# Patient Record
Sex: Male | Born: 1965 | Race: White | Hispanic: No | Marital: Married | Smoking: Never smoker
Health system: Southern US, Community
[De-identification: ages and names within clinical notes are randomized; demographics above are authoritative.]

## PROBLEM LIST (undated history)

## (undated) DIAGNOSIS — E1122 Type 2 diabetes mellitus with diabetic chronic kidney disease: Secondary | ICD-10-CM

## (undated) DIAGNOSIS — N181 Chronic kidney disease, stage 1: Secondary | ICD-10-CM

## (undated) DIAGNOSIS — K760 Fatty (change of) liver, not elsewhere classified: Secondary | ICD-10-CM

## (undated) DIAGNOSIS — K8010 Calculus of gallbladder with chronic cholecystitis without obstruction: Secondary | ICD-10-CM

## (undated) DIAGNOSIS — Z79899 Other long term (current) drug therapy: Secondary | ICD-10-CM

## (undated) DIAGNOSIS — E559 Vitamin D deficiency, unspecified: Secondary | ICD-10-CM

## (undated) DIAGNOSIS — I25119 Atherosclerotic heart disease of native coronary artery with unspecified angina pectoris: Secondary | ICD-10-CM

## (undated) DIAGNOSIS — G43909 Migraine, unspecified, not intractable, without status migrainosus: Secondary | ICD-10-CM

## (undated) DIAGNOSIS — R071 Chest pain on breathing: Secondary | ICD-10-CM

## (undated) DIAGNOSIS — G8929 Other chronic pain: Secondary | ICD-10-CM

## (undated) DIAGNOSIS — I1310 Hypertensive heart and chronic kidney disease without heart failure, with stage 1 through stage 4 chronic kidney disease, or unspecified chronic kidney disease: Secondary | ICD-10-CM

## (undated) DIAGNOSIS — M5136 Other intervertebral disc degeneration, lumbar region: Secondary | ICD-10-CM

## (undated) DIAGNOSIS — F4329 Adjustment disorder with other symptoms: Secondary | ICD-10-CM

## (undated) DIAGNOSIS — E669 Obesity, unspecified: Secondary | ICD-10-CM

## (undated) DIAGNOSIS — I130 Hypertensive heart and chronic kidney disease with heart failure and stage 1 through stage 4 chronic kidney disease, or unspecified chronic kidney disease: Secondary | ICD-10-CM

## (undated) DIAGNOSIS — I2510 Atherosclerotic heart disease of native coronary artery without angina pectoris: Secondary | ICD-10-CM

## (undated) DIAGNOSIS — E785 Hyperlipidemia, unspecified: Secondary | ICD-10-CM

## (undated) DIAGNOSIS — M545 Low back pain: Secondary | ICD-10-CM

## (undated) HISTORY — DX: Chest pain on breathing: R07.1

## (undated) HISTORY — DX: Other intervertebral disc degeneration, lumbar region: M51.36

## (undated) HISTORY — DX: Atherosclerotic heart disease of native coronary artery without angina pectoris: I25.10

## (undated) HISTORY — DX: Adjustment disorder with other symptoms: F43.29

## (undated) HISTORY — DX: Calculus of gallbladder with chronic cholecystitis without obstruction: K80.10

## (undated) HISTORY — DX: Migraine, unspecified, not intractable, without status migrainosus: G43.909

## (undated) HISTORY — DX: Vitamin D deficiency, unspecified: E55.9

## (undated) HISTORY — DX: Atherosclerotic heart disease of native coronary artery with unspecified angina pectoris: I25.119

## (undated) HISTORY — DX: Low back pain: M54.5

## (undated) HISTORY — PX: CORONARY ARTERY BYPASS GRAFT: SHX141

## (undated) HISTORY — DX: Hyperlipidemia, unspecified: E78.5

## (undated) HISTORY — DX: Fatty (change of) liver, not elsewhere classified: K76.0

## (undated) HISTORY — DX: Type 2 diabetes mellitus with diabetic chronic kidney disease: E11.22

## (undated) HISTORY — PX: CORONARY ANGIOPLASTY: SHX604

## (undated) HISTORY — DX: Other long term (current) drug therapy: Z79.899

## (undated) HISTORY — DX: Obesity, unspecified: E66.9

## (undated) HISTORY — DX: Chronic kidney disease, stage 1: N18.1

## (undated) HISTORY — DX: Hypertensive heart and chronic kidney disease with heart failure and stage 1 through stage 4 chronic kidney disease, or unspecified chronic kidney disease: I13.0

## (undated) HISTORY — PX: CARDIAC CATHETERIZATION: SHX172

## (undated) HISTORY — DX: Other chronic pain: G89.29

## (undated) HISTORY — DX: Hypertensive heart and chronic kidney disease without heart failure, with stage 1 through stage 4 chronic kidney disease, or unspecified chronic kidney disease: I13.10

## (undated) HISTORY — PX: SPINE SURGERY: SHX786

---

## 2009-02-02 ENCOUNTER — Ambulatory Visit: Payer: Self-pay | Admitting: Thoracic Surgery (Cardiothoracic Vascular Surgery)

## 2009-03-15 ENCOUNTER — Ambulatory Visit: Payer: Self-pay | Admitting: Thoracic Surgery (Cardiothoracic Vascular Surgery)

## 2011-06-19 ENCOUNTER — Encounter (INDEPENDENT_AMBULATORY_CARE_PROVIDER_SITE_OTHER): Payer: PRIVATE HEALTH INSURANCE

## 2011-06-19 DIAGNOSIS — S2329XA Dislocation of other parts of thorax, initial encounter: Secondary | ICD-10-CM

## 2011-06-20 NOTE — Assessment & Plan Note (Unsigned)
[facility] VISIT  [name], TIEGS DOB:  June 12, 1966                                        June 20, 2011 CHART #:  [id number]  HISTORY OF PRESENT ILLNESS:  The patient had four-vessel coronary bypass grafting on by Dr. Arvilla Market on February 03, 2019, for multivessel coronary artery disease and unstable angina pectoris.  He had an non-ST-elevation myocardial infarction prior to the surgery.  His postoperative course was uneventful and he was discharged home on the fourth postoperative day.  Since his discharge he continues to make progress from cardiac standpoint.  He returned to work which involves some strenuous labor. He has had persistent pain in his left chest while working with his arms and also notices pain when he turns his head to the right.  He also apparently had some numbness in his left arm.  He was seen by Dr. Dulce Sellar, [facility] [place], last month and was doing well from cardiac standpoint.  He was seen by Ms. Prescilla Sours, family nurse practitioner, last week for evaluation of his persistent chest pain.  He apparently was referred for a nerve conduction studies which according to the patient did not show any abnormality.  This study is not available.  On her suggestion, the patient presented to our office for evaluation of the chest discomfort.  The patient describes a sharp pain just the left of his breast bone below the clavicle when he exerts his upper extremities at work.  He also had some numbness down his medial left arm and into his hand but this symptom apparently comes and goes.  On exam, his heart showed regular rate and rhythm.  Breath sounds clear to auscultation. Sternotomy incision is intact and well healed.  Sternum itself appears stable.  He has some tenderness over the costochondral junction involving probably his second and third or third and fourth ribs on the left just below the clavicle.  Palpation in this  region during cough demonstrates some instability as well.  Chest x-ray obtained in the office today shows sternal wires to be in proper alignment.  The bony thorax appears normal except there maybe some increased space at the costochondral function involving the second or third rib on the left.  The patient's symptoms and findings were discussed with Dr. Arvilla Market.  He recommended referring the patient to Dr. Dewayne Shorter at the [facility] for consideration of plating of his ribs in this region to re- stabilize his chest wall and hopefully eliminate his pain.  This was offered to the patient.  Tera Mater. Arvilla Market, MD  MGR/MEDQ  D:  06/20/2011  T:  06/20/2011  Job:  [id number]

## 2011-06-26 ENCOUNTER — Encounter (INDEPENDENT_AMBULATORY_CARE_PROVIDER_SITE_OTHER): Payer: PRIVATE HEALTH INSURANCE | Admitting: Thoracic Surgery

## 2011-06-26 ENCOUNTER — Other Ambulatory Visit: Payer: Self-pay | Admitting: Thoracic Surgery

## 2011-06-26 DIAGNOSIS — R209 Unspecified disturbances of skin sensation: Secondary | ICD-10-CM

## 2011-06-26 DIAGNOSIS — S2329XA Dislocation of other parts of thorax, initial encounter: Secondary | ICD-10-CM

## 2011-06-26 DIAGNOSIS — R200 Anesthesia of skin: Secondary | ICD-10-CM

## 2011-06-26 DIAGNOSIS — G548 Other nerve root and plexus disorders: Secondary | ICD-10-CM

## 2011-06-27 NOTE — Letter (Signed)
June 26, 2011  Jeannett Senior A. Arvilla Market, MD [address] [place], [place]  [address]  Re:  [name], [name]                 DOB:  01-May-1966  Dear Brett Canales,  I saw the patient in the office for left anterior chest wall tightness and pain as well as left arm numbness.  He had coronary artery bypass in February 03, 2009, and his mediastinotomy incision has healed well and he has been worked up in the past for left chest pain that has been negative.  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  When he elevates his left arm, he feels pulling on his left anterior chest and there is some mild tenderness along the costochondral margin.  There was some question of instability.  He is referred here for evaluation.  His past medical history, he has no allergies.  His medications include Crestor, Janumet, aspirin, metoprolol and Cialis.  He has diabetes mellitus type 2, coronary artery disease, hypertension and dyslipidemia.  He has had periodontal abscess and paroxysmal atrial fibrillation.  Previous back surgery in 1991 and 1996 and coronary bypass in February 03, 2009.  FAMILY HISTORY:  Positive for diabetes and coronary artery disease.  SOCIAL HISTORY:  He is married, has 2 children.  Does not smoke. Occasional alcohol intake.  REVIEW OF SYSTEMS:  CARDIAC:  See history of present illness. PULMONARY:  No hemoptysis, fever, chills or excessive sputum.  No asthma or bronchitis. GI:  No GERD, nausea, vomiting and constipation. GU:  No kidney disease, dysuria or frequent urination. VASCULAR:  No claudication, DVT or TIAs. NEUROLOGICAL:  No dizziness, headaches, blackouts or seizures. PSYCHIATRIC:  No depression or nervousness. HEMATOLOGICAL:  No problems with bleeding or clotting disorder.  PHYSICAL EXAMINATION:  General:  He is a well-developed Hispanic male, in no acute distress.  Vital Signs:  His blood pressure is 144/84, pulse 72, respirations 16 and sats were 97%.   Head, Eyes, Ears, Nose And Throat:  Unremarkable.  Neck:  Supple without thyromegaly.  Chest: Clear to auscultation and percussion.  Heart:  Regular sinus rhythm.  No murmurs.  Abdomen:  Soft.  There is no hepatosplenomegaly.  Extremities: Pulses are 2+.  There is no clubbing or edema.  On exam of his left chest and palpation along the third, fourth and fifth costochondral junction, there is some mild tenderness, but I do not see any movement with any change in position of his left arm either with abduction.  He complains of numbness in his left arm, but has good strength and sensory in his left arm both in the ulnar and radial distributions.  He was placed off work until seeing me as I kept him off work for another week.  While I get a CT scan of the chest with contrast of coronary vascular or chest wall abnormalities, I am not sure if we can be of much help to him.  I did start him on Voltaren 75 mg twice a day and see if this helps him.  I will see him back again in the next week.  Ines Bloomer, M.D. Electronically Signed  DPB/MEDQ  D:  06/26/2011  T:  06/27/2011  Job:  [id number]

## 2011-07-03 ENCOUNTER — Ambulatory Visit
Admission: RE | Admit: 2011-07-03 | Discharge: 2011-07-03 | Disposition: A | Discharge status: Home / Self Care | Payer: PRIVATE HEALTH INSURANCE | Source: Ambulatory Visit | Attending: Thoracic Surgery | Admitting: Thoracic Surgery

## 2011-07-03 ENCOUNTER — Ambulatory Visit (INDEPENDENT_AMBULATORY_CARE_PROVIDER_SITE_OTHER): Payer: PRIVATE HEALTH INSURANCE | Admitting: Thoracic Surgery

## 2011-07-03 DIAGNOSIS — R200 Anesthesia of skin: Secondary | ICD-10-CM

## 2011-07-03 DIAGNOSIS — S2329XA Dislocation of other parts of thorax, initial encounter: Secondary | ICD-10-CM

## 2011-07-03 DIAGNOSIS — G548 Other nerve root and plexus disorders: Secondary | ICD-10-CM

## 2011-07-03 MED ORDER — IOHEXOL 300 MG/ML  SOLN
75.0000 mL | Freq: Once | INTRAMUSCULAR | Status: AC | PRN
  Administered 2011-07-03: 75 mL via INTRAVENOUS

## 2011-07-04 NOTE — Letter (Signed)
July 04, 2011  Jeannett Senior A. Arvilla Market, MD [address], [address], [place]  [address]  Re:  [name], [name]                 DOB:  July 04, 1966  Dear Brett Canales,  I appreciate the opportunity of seeing the patient.  As you know, he is complaining of numbness in his left arm and left upper chest pain particularly when he is doing heavy pushing and pulling at work.  He has had previous coronary bypass in March, 2012.  On palpation, there was some mild tenderness over the costochondral in the 2, 3, and 4.  We ordered a CT scan of this area and they did not see any fractures or any healing fractures, and I did not see any real costochondral separation. I think he is just having musculoskeletal pain.  We started him on Voltaren 75 mg twice a day and this helped some, and I wonder if he might benefit from a course of Neurontin or Lyrica.  I gave him a slip to stay out of work for another 2 weeks with no heavy lifting, pushing, or pulling, but I could not be of any further help.  His blood pressure was 126/79, pulse 70, respirations 18, sats were 98%.  I appreciate the opportunity of seeing this patient.  Ines Bloomer, M.D. Electronically Signed  DPB/MEDQ  D:  07/04/2011  T:  07/04/2011  Job:  [id number]

## 2011-08-14 ENCOUNTER — Ambulatory Visit (INDEPENDENT_AMBULATORY_CARE_PROVIDER_SITE_OTHER): Payer: PRIVATE HEALTH INSURANCE | Admitting: Thoracic Surgery (Cardiothoracic Vascular Surgery)

## 2011-08-14 DIAGNOSIS — M25519 Pain in unspecified shoulder: Secondary | ICD-10-CM

## 2011-08-15 NOTE — Progress Notes (Signed)
Mr Bartleson returns for follow up to his visit with Dr Edwyna Shell. His pain has improved since finishing his Voltaren. He does feel some pulling when  he extends his left arm laterally, but no true pain or numbness. He wishes to return to work on Aug 28, 2011. He was provided a note from [place]. He will contact us if he develops any future issues or recurrent pain. We discussed sending him to an orthopedic surgeon if the  pain does recur. RTC PRN.

## 2015-11-10 DIAGNOSIS — R0789 Other chest pain: Secondary | ICD-10-CM

## 2015-11-10 DIAGNOSIS — R071 Chest pain on breathing: Secondary | ICD-10-CM

## 2015-11-10 DIAGNOSIS — I25119 Atherosclerotic heart disease of native coronary artery with unspecified angina pectoris: Secondary | ICD-10-CM

## 2015-11-10 DIAGNOSIS — E785 Hyperlipidemia, unspecified: Secondary | ICD-10-CM

## 2015-11-10 DIAGNOSIS — I130 Hypertensive heart and chronic kidney disease with heart failure and stage 1 through stage 4 chronic kidney disease, or unspecified chronic kidney disease: Secondary | ICD-10-CM

## 2015-11-10 HISTORY — DX: Atherosclerotic heart disease of native coronary artery with unspecified angina pectoris: I25.119

## 2015-11-10 HISTORY — DX: Hyperlipidemia, unspecified: E78.5

## 2015-11-10 HISTORY — DX: Other chest pain: R07.89

## 2015-11-10 HISTORY — DX: Hypertensive heart and chronic kidney disease with heart failure and stage 1 through stage 4 chronic kidney disease, or unspecified chronic kidney disease: I13.0

## 2016-01-15 DIAGNOSIS — I2510 Atherosclerotic heart disease of native coronary artery without angina pectoris: Secondary | ICD-10-CM

## 2016-01-15 DIAGNOSIS — K760 Fatty (change of) liver, not elsewhere classified: Secondary | ICD-10-CM

## 2016-01-15 DIAGNOSIS — M545 Low back pain, unspecified: Secondary | ICD-10-CM

## 2016-01-15 DIAGNOSIS — K8010 Calculus of gallbladder with chronic cholecystitis without obstruction: Secondary | ICD-10-CM

## 2016-01-15 DIAGNOSIS — N181 Chronic kidney disease, stage 1: Secondary | ICD-10-CM | POA: Insufficient documentation

## 2016-01-15 DIAGNOSIS — G43909 Migraine, unspecified, not intractable, without status migrainosus: Secondary | ICD-10-CM

## 2016-01-15 DIAGNOSIS — E559 Vitamin D deficiency, unspecified: Secondary | ICD-10-CM

## 2016-01-15 DIAGNOSIS — Z79899 Other long term (current) drug therapy: Secondary | ICD-10-CM

## 2016-01-15 DIAGNOSIS — E669 Obesity, unspecified: Secondary | ICD-10-CM

## 2016-01-15 DIAGNOSIS — G8929 Other chronic pain: Secondary | ICD-10-CM | POA: Insufficient documentation

## 2016-01-15 DIAGNOSIS — E1122 Type 2 diabetes mellitus with diabetic chronic kidney disease: Secondary | ICD-10-CM

## 2016-01-15 DIAGNOSIS — I1310 Hypertensive heart and chronic kidney disease without heart failure, with stage 1 through stage 4 chronic kidney disease, or unspecified chronic kidney disease: Secondary | ICD-10-CM

## 2016-01-15 DIAGNOSIS — F4329 Adjustment disorder with other symptoms: Secondary | ICD-10-CM

## 2016-01-15 HISTORY — DX: Adjustment disorder with other symptoms: F43.29

## 2016-01-15 HISTORY — DX: Atherosclerotic heart disease of native coronary artery without angina pectoris: I25.10

## 2016-01-15 HISTORY — DX: Calculus of gallbladder with chronic cholecystitis without obstruction: K80.10

## 2016-01-15 HISTORY — DX: Obesity, unspecified: E66.9

## 2016-01-15 HISTORY — DX: Other chronic pain: G89.29

## 2016-01-15 HISTORY — DX: Chronic kidney disease, stage 1: N18.1

## 2016-01-15 HISTORY — DX: Migraine, unspecified, not intractable, without status migrainosus: G43.909

## 2016-01-15 HISTORY — DX: Low back pain, unspecified: M54.50

## 2016-01-15 HISTORY — DX: Vitamin D deficiency, unspecified: E55.9

## 2016-01-15 HISTORY — DX: Type 2 diabetes mellitus with diabetic chronic kidney disease: E11.22

## 2016-01-15 HISTORY — DX: Fatty (change of) liver, not elsewhere classified: K76.0

## 2016-01-15 HISTORY — DX: Hypertensive heart and chronic kidney disease without heart failure, with stage 1 through stage 4 chronic kidney disease, or unspecified chronic kidney disease: I13.10

## 2016-01-15 HISTORY — DX: Other long term (current) drug therapy: Z79.899

## 2016-07-30 DIAGNOSIS — M5136 Other intervertebral disc degeneration, lumbar region: Secondary | ICD-10-CM

## 2016-07-30 DIAGNOSIS — M51369 Other intervertebral disc degeneration, lumbar region without mention of lumbar back pain or lower extremity pain: Secondary | ICD-10-CM

## 2016-07-30 HISTORY — DX: Other intervertebral disc degeneration, lumbar region: M51.36

## 2016-07-30 HISTORY — DX: Other intervertebral disc degeneration, lumbar region without mention of lumbar back pain or lower extremity pain: M51.369

## 2017-12-01 ENCOUNTER — Encounter: Payer: Self-pay | Admitting: *Deleted

## 2017-12-04 ENCOUNTER — Encounter: Payer: Self-pay | Admitting: Cardiology

## 2017-12-04 NOTE — Progress Notes (Signed)
Cardiology Office Note:    Date:  12/05/2017   ID:  [name], DOB 10-Sep-1966, MRN [id number]  PCP:  Everlean Cherry, MD  Cardiologist:  Norman Herrlich, MD    Referring MD: No ref. provider found    ASSESSMENT:    1. Coronary artery disease involving native coronary artery of native heart with angina pectoris (HCC)   2. 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 Hyperlipidemia, unspecified hyperlipidemia type    PLAN:    In order of problems listed above:  1. Stable continue current medical treatment long-term dual antiplatelet therapy ranolazine and reinstitute statin therapy.  He was intolerant of high intensity statins 2. Stable continue current treatment ACE inhibitor 3. Resume his statin consider combined treatment if LDL remains greater than 50   Next appointment: 6 months   Medication Adjustments/Labs and Tests Ordered: Current medicines are reviewed at length with the patient today.  Concerns regarding medicines are outlined above.  Orders Placed This Encounter  Procedures  . EKG 12-Lead   Meds ordered this encounter  Medications  . pravastatin (PRAVACHOL) 80 MG tablet    Sig: Take 1 tablet (80 mg total) by mouth daily.    Dispense:  90 tablet    Refill:  3    Chief Complaint  Patient presents with  . Annual Exam  . Coronary Artery Disease  . Hyperlipidemia    History of Present Illness:    [name] is a 52 y.o. male with a hx of CAD, Dyslipidemia, HTN, S/P CABG in 2012 and subsequent   PCI in 2012 and 2014 and T2DM  last seen in February 2018. Compliance with diet, lifestyle and medications: Yes Overall he is doing well he is not having exertional angina chest wall pain present since bypass surgery.  Some fashion he is off his statin he agrees to resume.  He continues to exercise and no complaints of claudication syncope TIA or palpitation.  Diabetes is managed with his PCP Past  Medical History:  Diagnosis Date  . 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 CAD, multiple vessel 01/15/2016   Overview:  PCI and Resolute DES to OM2 03/24/2013.    Marland Kitchen Cholelithiasis with chronic cholecystitis without biliary obstruction 01/15/2016  . Chronic midline low back pain 01/15/2016  . CKD stage G1/A1, GFR > 90 and albumin creatinine ratio <30 mg/g 01/15/2016  . Coronary artery disease involving native coronary artery of native heart with angina pectoris (HCC) 11/10/2015   Overview:  Overview:  CABG 2012 PTCA of PLVB 12/21/10; 27 Dec 2010. PCI and Resolute DES to OM2 03/24/13 , cath with patent LTA and SVG, the OM stenosis was distal to the graft, EF 60% Last OP visit 07/05/2014:  [name] presents for follow up for CAD.  Assessment  . History of a CABG was done February 03, 2009  CAD, multiple vessel Well Controlled (414.00) (I25.10); PCI and Resolute DES to  . Costochondral chest pain 11/10/2015  . DDD (degenerative disc disease), lumbar 07/30/2016  . Drug therapy 01/15/2016  . Hepatic steatosis 01/15/2016  . Hyperlipidemia 11/10/2015   Overview:  poorly statin tolerant, he is on a low intensity statin with a LDL>100   . 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 Migraine 01/15/2016  . Mixed emotional features as adjustment reaction 01/15/2016  .  Obesity (BMI 30-39.9) 01/15/2016  . Type 2 diabetes mellitus with stage 1 chronic kidney disease, without long-term current use of insulin (HCC) 01/15/2016  . Vitamin D deficiency 01/15/2016    Past Surgical History:  Procedure Laterality Date  . CARDIAC CATHETERIZATION    . CORONARY ANGIOPLASTY    . CORONARY ARTERY BYPASS GRAFT    . SPINE SURGERY      Current Medications: Current Meds  Medication Sig  . aspirin EC 81 MG tablet Take by mouth.  . B-D ULTRA-FINE 33 LANCETS MISC by Misc.(Non-Drug; Combo Route) route.  . Blood  Glucose Monitoring Suppl (GLUCOCOM BLOOD GLUCOSE MONITOR) DEVI by Misc.(Non-Drug; Combo Route) route.  . clopidogrel (PLAVIX) 75 MG tablet Take by mouth.  . DOCOSAHEXAENOIC ACID PO Take 1 g by mouth.  . Dulaglutide 0.75 MG/0.5ML SOPN Inject into the skin.  Marland Kitchen gabapentin (NEURONTIN) 100 MG capsule as needed.  Marland Kitchen lisinopril (PRINIVIL,ZESTRIL) 20 MG tablet 1 daily for BP  . metFORMIN (GLUCOPHAGE) 1000 MG tablet TAKE 1 TABLET BY MOUTH TWICE A DAY WITH MEALS FOR DIABITIES  . nitroGLYCERIN (NITROSTAT) 0.4 MG SL tablet Place under the tongue.  . ranolazine (RANEXA) 500 MG 12 hr tablet Take by mouth.  . Vitamin D, Ergocalciferol, (DRISDOL) 50000 units CAPS capsule Take by mouth.     Allergies:   Patient has no known allergies.   Social History   Socioeconomic History  . Marital status: Married    Spouse name: None  . Number of children: None  . Years of education: None  . Highest education level: None  Social Needs  . Financial resource strain: None  . Food insecurity - worry: None  . Food insecurity - inability: None  . Transportation needs - medical: None  . Transportation needs - non-medical: None  Occupational History  . None  Tobacco Use  . Smoking status: Never Smoker  . Smokeless tobacco: Never Used  Substance and Sexual Activity  . Alcohol use: No    Frequency: Never  . Drug use: No  . Sexual activity: None  Other Topics Concern  . None  Social History Narrative  . None     Family History: The patient's family history includes Diabetes in his brother and sister; Diabetes type I in his son; Hypertension in his brother. ROS:   Please see the history of present illness.    All other systems reviewed and are negative.  EKGs/Labs/Other Studies Reviewed:    The following studies were reviewed today:  EKG:  EKG ordered today.  The ekg ordered today demonstrates sinus rhythm normal  Recent Labs: No results found for requested labs within last 8760 hours.  Recent Lipid  Panel No results found for: CHOL, TRIG, HDL, CHOLHDL, VLDL, LDLCALC, LDLDIRECT  Physical Exam:    VS:  BP (!) 146/80 (BP Location: Right Arm, Patient Position: Sitting, Cuff Size: Normal)   Pulse 63   Ht 5\' 6"  (1.676 m)   Wt 197 lb (89.4 kg)   SpO2 98%   BMI 31.80 kg/m     Wt Readings from Last 3 Encounters:  12/05/17 197 lb (89.4 kg)     GEN: Tenderness of the costochondral junction bilaterally well nourished, well developed in no acute distress HEENT: Normal NECK: No JVD; No carotid bruits LYMPHATICS: No lymphadenopathy CARDIAC: RRR, no murmurs, rubs, gallops RESPIRATORY:  Clear to auscultation without rales, wheezing or rhonchi  ABDOMEN: Soft, non-tender, non-distended MUSCULOSKELETAL:  No edema; No deformity  SKIN: Warm and dry NEUROLOGIC:  Alert  and oriented x 3 PSYCHIATRIC:  Normal affect    Signed, Norman HerrlichBrian Tawney Vanorman, MD  12/05/2017 11:20 AM    [facility]

## 2017-12-05 ENCOUNTER — Encounter: Payer: Self-pay | Admitting: Cardiology

## 2017-12-05 ENCOUNTER — Ambulatory Visit (INDEPENDENT_AMBULATORY_CARE_PROVIDER_SITE_OTHER): Payer: BLUE CROSS/BLUE SHIELD | Admitting: Cardiology

## 2017-12-05 VITALS — BP 146/80 | HR 63 | Ht 66.0 in | Wt 197.0 lb

## 2017-12-05 DIAGNOSIS — N181 Chronic kidney disease, stage 1: Secondary | ICD-10-CM

## 2017-12-05 DIAGNOSIS — I1310 Hypertensive heart and chronic kidney disease without heart failure, with stage 1 through stage 4 chronic kidney disease, or unspecified chronic kidney disease: Secondary | ICD-10-CM | POA: Diagnosis not present

## 2017-12-05 DIAGNOSIS — E785 Hyperlipidemia, unspecified: Secondary | ICD-10-CM | POA: Diagnosis not present

## 2017-12-05 DIAGNOSIS — I25119 Atherosclerotic heart disease of native coronary artery with unspecified angina pectoris: Secondary | ICD-10-CM | POA: Diagnosis not present

## 2017-12-05 MED ORDER — PRAVASTATIN SODIUM 80 MG PO TABS: 80.0000 mg | ORAL_TABLET | Freq: Every day | ORAL | 3 refills | Status: DC

## 2017-12-05 NOTE — Patient Instructions (Signed)
Medication Instructions:  Your physician has recommended you make the following change in your medication:  START pravastatin 80 mg daily  Labwork: None  Testing/Procedures: You had an EKG today.  Follow-Up: Your physician wants you to follow-up in: 6 months. You will receive a reminder letter in the mail two months in advance. If you don't receive a letter, please call our office to schedule the follow-up appointment.  Any Other Special Instructions Will Be Listed Below (If Applicable).     If you need a refill on your cardiac medications before your next appointment, please call your pharmacy.

## 2017-12-26 ENCOUNTER — Emergency Department (HOSPITAL_COMMUNITY)
Admission: EM | Admit: 2017-12-26 | Discharge: 2017-12-26 | Disposition: A | Discharge status: Home / Self Care | Payer: BLUE CROSS/BLUE SHIELD | Attending: Emergency Medicine | Admitting: Emergency Medicine

## 2017-12-26 ENCOUNTER — Emergency Department (HOSPITAL_COMMUNITY): Payer: BLUE CROSS/BLUE SHIELD

## 2017-12-26 ENCOUNTER — Other Ambulatory Visit: Payer: Self-pay

## 2017-12-26 ENCOUNTER — Encounter (HOSPITAL_COMMUNITY): Payer: Self-pay | Admitting: *Deleted

## 2017-12-26 DIAGNOSIS — Z7982 Long term (current) use of aspirin: Secondary | ICD-10-CM | POA: Insufficient documentation

## 2017-12-26 DIAGNOSIS — S20212A Contusion of left front wall of thorax, initial encounter: Secondary | ICD-10-CM

## 2017-12-26 DIAGNOSIS — Y929 Unspecified place or not applicable: Secondary | ICD-10-CM | POA: Diagnosis not present

## 2017-12-26 DIAGNOSIS — S76012A Strain of muscle, fascia and tendon of left hip, initial encounter: Secondary | ICD-10-CM

## 2017-12-26 DIAGNOSIS — I129 Hypertensive chronic kidney disease with stage 1 through stage 4 chronic kidney disease, or unspecified chronic kidney disease: Secondary | ICD-10-CM | POA: Insufficient documentation

## 2017-12-26 DIAGNOSIS — S299XXA Unspecified injury of thorax, initial encounter: Secondary | ICD-10-CM | POA: Diagnosis present

## 2017-12-26 DIAGNOSIS — Y939 Activity, unspecified: Secondary | ICD-10-CM | POA: Diagnosis not present

## 2017-12-26 DIAGNOSIS — Z7984 Long term (current) use of oral hypoglycemic drugs: Secondary | ICD-10-CM | POA: Diagnosis not present

## 2017-12-26 DIAGNOSIS — M542 Cervicalgia: Secondary | ICD-10-CM | POA: Diagnosis not present

## 2017-12-26 DIAGNOSIS — N189 Chronic kidney disease, unspecified: Secondary | ICD-10-CM | POA: Diagnosis not present

## 2017-12-26 DIAGNOSIS — Z7902 Long term (current) use of antithrombotics/antiplatelets: Secondary | ICD-10-CM | POA: Diagnosis not present

## 2017-12-26 DIAGNOSIS — E1122 Type 2 diabetes mellitus with diabetic chronic kidney disease: Secondary | ICD-10-CM | POA: Diagnosis not present

## 2017-12-26 DIAGNOSIS — R52 Pain, unspecified: Secondary | ICD-10-CM

## 2017-12-26 DIAGNOSIS — Y999 Unspecified external cause status: Secondary | ICD-10-CM | POA: Diagnosis not present

## 2017-12-26 MED ORDER — OXYCODONE-ACETAMINOPHEN 5-325 MG PO TABS
2.0000 | ORAL_TABLET | Freq: Once | ORAL | Status: AC
  Administered 2017-12-26: 2 via ORAL
  Filled 2017-12-26: qty 2

## 2017-12-26 MED ORDER — HYDROCODONE-ACETAMINOPHEN 5-325 MG PO TABS: 2.0000 | ORAL_TABLET | ORAL | 0 refills | Status: DC | PRN

## 2017-12-26 MED ORDER — OXYCODONE-ACETAMINOPHEN 5-325 MG PO TABS
1.0000 | ORAL_TABLET | Freq: Once | ORAL | Status: DC
  Filled 2017-12-26: qty 1

## 2017-12-26 NOTE — ED Notes (Signed)
Called pt to re check vitals. No answer however there is a note off to side stating that pt is in room with wife. [name] looked where pt wife is but pt wife does not have the same last name
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
## 2017-12-26 NOTE — ED Notes (Signed)
Pt. Return from XRAY via stretcher. 
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
## 2017-12-26 NOTE — ED Notes (Signed)
Pt. To XRAY via stretcher. 

## 2017-12-26 NOTE — ED Notes (Signed)
Pt c/o L sided Rib pain

## 2017-12-26 NOTE — Discharge Instructions (Signed)
Use ice and tylenol as needed for pain.   For severe pain take norco or vicodin however realize they have the potential for addiction and it can make you sleepy and has tylenol in it.  No operating machinery while taking. If you were given medicines take as directed.  If you are on coumadin or contraceptives realize their levels and effectiveness is altered by many different medicines.  If you have any reaction (rash, tongues swelling, other) to the medicines stop taking and see a physician.    If your blood pressure was elevated in the ER make sure you follow up for management with a primary doctor or return for chest pain, shortness of breath or stroke symptoms.  Please follow up as directed and return to the ER or see a physician for new or worsening symptoms.  Thank you. Vitals:   12/26/17 1546 12/26/17 1547  BP:  (!) 169/88  Pulse:  85  Resp:  16  Temp:  98.6 F (37 C)  TempSrc:  Oral  SpO2: 97% 99%

## 2019-01-25 NOTE — Progress Notes (Signed)
Cardiology Office Note:    Date:  01/26/2019   ID:  [name], DOB May 19, 1966, MRN [id number]  PCP:  Everlean Cherry, MD  Cardiologist:  Norman Herrlich, MD    Referring MD: Everlean Cherry, MD    ASSESSMENT:    1. Coronary artery disease involving native coronary artery of native heart with angina pectoris (HCC)   2. 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 Type 2 diabetes mellitus with stage 1 chronic kidney disease, without long-term current use of insulin (HCC)   4. Hyperlipidemia, unspecified hyperlipidemia type    PLAN:    In order of problems listed above:  1. Stable CAD has had no anginal discomfort I would not repeat ischemia evaluation at this time and continue his current medical therapy. 2. Stable hypertension blood pressure is at target continue current treatment 3. Managed by his PCP A1c is above 7 4. Poorly statin tolerant for now would continue his current treatment I would not transition to PCSK9 inhibitor   Next appointment: 1 year   Medication Adjustments/Labs and Tests Ordered: Current medicines are reviewed at length with the patient today.  Concerns regarding medicines are outlined above.  No orders of the defined types were placed in this encounter.  No orders of the defined types were placed in this encounter.   Chief Complaint  Patient presents with  . Follow-up  . Coronary Artery Disease    History of Present Illness:    [name] is a 53 y.o. male with a hx of CAD, Dyslipidemia, HTN, S/P CABG in 2012 and subsequent   PCI in 2012 and 2014 and T2DM  last seen 12/05/17. Compliance with diet, lifestyle and medications: yes  He is displeased with the quality of his life limitations and is frustrated with the process of disability he continues to have sharp localized sternal pain with position BX and go outside and walk and does not have exertional angina no edema orthopnea  palpitation or syncope.  He is not needed nitroglycerin Past Medical History:  Diagnosis Date  . 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 CAD, multiple vessel 01/15/2016   Overview:  PCI and Resolute DES to OM2 03/24/2013.    Marland Kitchen Cholelithiasis with chronic cholecystitis without biliary obstruction 01/15/2016  . Chronic midline low back pain 01/15/2016  . CKD stage G1/A1, GFR > 90 and albumin creatinine ratio <30 mg/g 01/15/2016  . Coronary artery disease involving native coronary artery of native heart with angina pectoris (HCC) 11/10/2015   Overview:  Overview:  CABG 2012 PTCA of PLVB 12/21/10; 27 Dec 2010. PCI and Resolute DES to OM2 03/24/13 , cath with patent LTA and SVG, the OM stenosis was distal to the graft, EF 60% Last OP visit 07/05/2014:  [name] presents for follow up for CAD.  Assessment  . History of a CABG was done February 03, 2009  CAD, multiple vessel Well Controlled (414.00) (I25.10); PCI and Resolute DES to  . Costochondral chest pain 11/10/2015  . DDD (degenerative disc disease), lumbar 07/30/2016  . Drug therapy 01/15/2016  . Hepatic steatosis 01/15/2016  . Hyperlipidemia 11/10/2015   Overview:  poorly statin tolerant, he is on a low intensity statin with a LDL>100   . 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 Migraine 01/15/2016  . Mixed emotional features as  adjustment reaction 01/15/2016  . Obesity (BMI 30-39.9) 01/15/2016  . Type 2 diabetes mellitus with stage 1 chronic kidney disease, without long-term current use of insulin (HCC) 01/15/2016  . Vitamin D deficiency 01/15/2016    Past Surgical History:  Procedure Laterality Date  . CARDIAC CATHETERIZATION    . CORONARY ANGIOPLASTY    . CORONARY ARTERY BYPASS GRAFT    . SPINE SURGERY      Current Medications: Current Meds  Medication Sig  . aspirin EC 81 MG tablet Take 81 mg by mouth daily.   . B-D  ULTRA-FINE 33 LANCETS MISC by Misc.(Non-Drug; Combo Route) route.  . Blood Glucose Monitoring Suppl (GLUCOCOM BLOOD GLUCOSE MONITOR) DEVI by Misc.(Non-Drug; Combo Route) route.  . clopidogrel (PLAVIX) 75 MG tablet Take 75 mg by mouth daily.   . DOCOSAHEXAENOIC ACID PO Take 1 g by mouth daily.   Marland Kitchen gabapentin (NEURONTIN) 100 MG capsule Take 100 mg by mouth as needed.   Marland Kitchen glimepiride (AMARYL) 2 MG tablet TAKE 1 TABLET (2 MG TOTAL) BY MOUTH 2 TIMES DAILY BEFORE MEALS.  . HYDROcodone-acetaminophen (NORCO) 5-325 MG tablet Take 2 tablets by mouth every 4 (four) hours as needed.  Marland Kitchen lisinopril (PRINIVIL,ZESTRIL) 20 MG tablet Take 20 mg by mouth daily.   . metFORMIN (GLUCOPHAGE) 1000 MG tablet TAKE 1 TABLET BY MOUTH TWICE A DAY WITH MEALS FOR DIABITIES  . nitroGLYCERIN (NITROSTAT) 0.4 MG SL tablet Place 0.4 mg under the tongue every 5 (five) minutes as needed.   . ranolazine (RANEXA) 500 MG 12 hr tablet Take 500 mg by mouth 2 (two) times daily.   . rosuvastatin (CRESTOR) 20 MG tablet Take 20 mg by mouth daily.  . Vitamin D, Ergocalciferol, (DRISDOL) 50000 units CAPS capsule Take 50,000 Units by mouth every 7 (seven) days.      Allergies:   Dulaglutide   Social History   Socioeconomic History  . Marital status: Married    Spouse name: Not on file  . Number of children: Not on file  . Years of education: Not on file  . Highest education level: Not on file  Occupational History  . Not on file  Social Needs  . Financial resource strain: Not on file  . Food insecurity:    Worry: Not on file    Inability: Not on file  . Transportation needs:    Medical: Not on file    Non-medical: Not on file  Tobacco Use  . Smoking status: Never Smoker  . Smokeless tobacco: Never Used  Substance and Sexual Activity  . Alcohol use: No    Frequency: Never  . Drug use: No  . Sexual activity: Not on file  Lifestyle  . Physical activity:    Days per week: Not on file    Minutes per session: Not on file  .  Stress: Not on file  Relationships  . Social connections:    Talks on phone: Not on file    Gets together: Not on file    Attends religious service: Not on file    Active member of club or organization: Not on file    Attends meetings of clubs or organizations: Not on file    Relationship status: Not on file  Other Topics Concern  . Not on file  Social History Narrative  . Not on file     Family History: The patient's family history includes Diabetes in his brother and sister; Diabetes type I in his son; Hypertension in his brother. ROS:  Please see the history of present illness.    All other systems reviewed and are negative.  EKGs/Labs/Other Studies Reviewed:    The following studies were reviewed today:  EKG:  EKG ordered today and personally reviewed.  The ekg ordered today demonstrates lateral ischemic T inversion and SRTH  Recent Labs:   01/04/19 a1c 7.4%  No results found for requested labs within last 8760 hours.  Recent Lipid Panel   05/29/18 Chol 174 HDL 31 LDL 115 and normal CMP No results found for: CHOL, TRIG, HDL, CHOLHDL, VLDL, LDLCALC, LDLDIRECT  Physical Exam:    VS:  BP 130/86 (BP Location: Right Arm, Patient Position: Sitting, Cuff Size: Normal)   Pulse 62   Ht 5\' 6"  (1.676 m)   Wt 194 lb (88 kg)   SpO2 97%   BMI 31.31 kg/m     Wt Readings from Last 3 Encounters:  01/26/19 194 lb (88 kg)  12/05/17 197 lb (89.4 kg)     GEN:  Well nourished, well developed in no acute distress HEENT: Normal NECK: No JVD; No carotid bruits LYMPHATICS: No lymphadenopathy CARDIAC: RRR, no murmurs, rubs, gallops RESPIRATORY:  Clear to auscultation without rales, wheezing or rhonchi  ABDOMEN: Soft, non-tender, non-distended MUSCULOSKELETAL:  No edema; No deformity  SKIN: Warm and dry NEUROLOGIC:  Alert and oriented x 3 PSYCHIATRIC:  Normal affect    Signed, Norman Herrlich, MD  01/26/2019 9:28 AM    [facility]

## 2019-01-26 ENCOUNTER — Ambulatory Visit (INDEPENDENT_AMBULATORY_CARE_PROVIDER_SITE_OTHER): Payer: BLUE CROSS/BLUE SHIELD | Admitting: Cardiology

## 2019-01-26 ENCOUNTER — Encounter: Payer: Self-pay | Admitting: Cardiology

## 2019-01-26 VITALS — BP 130/86 | HR 62 | Ht 66.0 in | Wt 194.0 lb

## 2019-01-26 DIAGNOSIS — E785 Hyperlipidemia, unspecified: Secondary | ICD-10-CM | POA: Diagnosis not present

## 2019-01-26 DIAGNOSIS — I1310 Hypertensive heart and chronic kidney disease without heart failure, with stage 1 through stage 4 chronic kidney disease, or unspecified chronic kidney disease: Secondary | ICD-10-CM | POA: Diagnosis not present

## 2019-01-26 DIAGNOSIS — E1122 Type 2 diabetes mellitus with diabetic chronic kidney disease: Secondary | ICD-10-CM | POA: Diagnosis not present

## 2019-01-26 DIAGNOSIS — I25119 Atherosclerotic heart disease of native coronary artery with unspecified angina pectoris: Secondary | ICD-10-CM | POA: Diagnosis not present

## 2019-01-26 DIAGNOSIS — N181 Chronic kidney disease, stage 1: Secondary | ICD-10-CM

## 2019-01-26 NOTE — Patient Instructions (Signed)

## 2019-07-08 ENCOUNTER — Telehealth: Payer: Self-pay | Admitting: Cardiology

## 2019-07-08 MED ORDER — RANOLAZINE ER 500 MG PO TB12: 500.0000 mg | ORAL_TABLET | Freq: Two times a day (BID) | ORAL | 1 refills | Status: DC

## 2019-07-08 NOTE — Telephone Encounter (Signed)
Rx for Ranexa #180 sent to [facility] as requested.

## 2019-07-08 NOTE — Telephone Encounter (Signed)
°*  STAT* If patient is at the pharmacy, call can be transferred to refill team.   1. Which medications need to be refilled? (please list name of each medication and dose if known) Ranexa  2. Which pharmacy/location (including street and city if local pharmacy) is medication to be sent to? [facility] on [address] [place]  3. Do they need a 30 day or 90 day supply? [place]

## 2019-09-07 ENCOUNTER — Telehealth: Payer: Self-pay

## 2019-09-07 DIAGNOSIS — I2510 Atherosclerotic heart disease of native coronary artery without angina pectoris: Secondary | ICD-10-CM | POA: Diagnosis not present

## 2019-09-07 DIAGNOSIS — R079 Chest pain, unspecified: Secondary | ICD-10-CM | POA: Diagnosis not present

## 2019-09-07 MED ORDER — NITROGLYCERIN 0.4 MG SL SUBL: 0.4000 mg | SUBLINGUAL_TABLET | SUBLINGUAL | 3 refills | Status: DC | PRN

## 2019-09-07 NOTE — Telephone Encounter (Signed)
Called and left voice message requesting return call to schedule appt. Pt calls back stating the chest pain he had yesterday was 8-9/10, radiated to left arm and never completely went away.  It's currently 4/10 in intensity. Instructed to call 911/go to ER. He agrees and will go to [facility] now.

## 2019-09-07 NOTE — Telephone Encounter (Signed)
Patient walked in to clinic requesting to schedule appt with Dr. Bettina Gavia and get a refill on ntg prescription. Reports having had an episodes of chest pain the other day and has run out of ntg. Denies chest pain now.  Informed that Dr. Bettina Gavia not in the office today but will refill ntg.and call him with next available appointment time.

## 2019-09-08 DIAGNOSIS — I2510 Atherosclerotic heart disease of native coronary artery without angina pectoris: Secondary | ICD-10-CM | POA: Diagnosis not present

## 2019-09-08 DIAGNOSIS — R079 Chest pain, unspecified: Secondary | ICD-10-CM | POA: Diagnosis not present

## 2019-09-13 ENCOUNTER — Ambulatory Visit (INDEPENDENT_AMBULATORY_CARE_PROVIDER_SITE_OTHER): Payer: BC Managed Care – PPO | Admitting: Cardiology

## 2019-09-13 ENCOUNTER — Encounter: Payer: Self-pay | Admitting: Cardiology

## 2019-09-13 ENCOUNTER — Other Ambulatory Visit: Payer: Self-pay

## 2019-09-13 VITALS — BP 112/70 | HR 62 | Ht 66.0 in | Wt 190.0 lb

## 2019-09-13 DIAGNOSIS — E782 Mixed hyperlipidemia: Secondary | ICD-10-CM

## 2019-09-13 DIAGNOSIS — I25810 Atherosclerosis of coronary artery bypass graft(s) without angina pectoris: Secondary | ICD-10-CM | POA: Diagnosis not present

## 2019-09-13 DIAGNOSIS — E669 Obesity, unspecified: Secondary | ICD-10-CM | POA: Diagnosis not present

## 2019-09-13 DIAGNOSIS — E119 Type 2 diabetes mellitus without complications: Secondary | ICD-10-CM

## 2019-09-13 DIAGNOSIS — Z794 Long term (current) use of insulin: Secondary | ICD-10-CM

## 2019-09-13 NOTE — Patient Instructions (Signed)
Medication Instructions:  Your physician recommends that you continue on your current medications as directed. Please refer to the Current Medication list given to you today.  *If you need a refill on your cardiac medications before your next appointment, please call your pharmacy*  Lab Work: None If you have labs (blood work) drawn today and your tests are completely normal, you will receive your results only by: Marland Kitchen MyChart Message (if you have MyChart) OR . A paper copy in the mail If you have any lab test that is abnormal or we need to change your treatment, we will call you to review the results.  Testing/Procedures: None  Follow-Up: At [facility], you and your health needs are our priority.  As part of our continuing mission to provide you with exceptional heart care, we have created designated Provider Care Teams.  These Care Teams include your primary Cardiologist (physician) and Advanced Practice Providers (APPs -  Physician Assistants and Nurse Practitioners) who all work together to provide you with the care you need, when you need it.  Your next appointment:   3 months  The format for your next appointment:   In Person  Provider:   Shirlee More, MD or Dr Harriet Masson  Other Instructions

## 2019-09-13 NOTE — Progress Notes (Signed)
Cardiology Office Note:    Date:  09/13/2019   ID:  [name], DOB 1966/06/26, MRN [id number]  PCP:  Everlean Cherry, MD  Cardiologist:  No primary care provider on file.  Electrophysiologist:  None   Referring MD: Everlean Cherry, MD   Chief Complaint  Patient presents with  . Hospitalization Follow-up    History of Present Illness:    [name] is a 53 y.o. male with a hx of CAD status post CABG in 2012, subsequent PCI in 2012 in 2014, Dyslipidemia, HTN, and type 2 diabetes.  The patient last saw Dr. Dulce Sellar January 26, 2019.  During our visit the patient was stable with no changes in medication.  In the interim he was admitted at [facility] after presenting with chest pain.  During his hospitalization with imaging studies were performed and this was unremarkable therefore patient was discharged and recommended to see his cardiologist.  The patient reports that the since his hospitalization he has not had any repeat chest pain.  He denies any shortness of breath, nausea, vomiting.  Past Medical History:  Diagnosis Date  . 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 CAD, multiple vessel 01/15/2016   Overview:  PCI and Resolute DES to OM2 03/24/2013.    Marland Kitchen Cholelithiasis with chronic cholecystitis without biliary obstruction 01/15/2016  . Chronic midline low back pain 01/15/2016  . CKD stage G1/A1, GFR > 90 and albumin creatinine ratio <30 mg/g 01/15/2016  . Coronary artery disease involving native coronary artery of native heart with angina pectoris (HCC) 11/10/2015   Overview:  Overview:  CABG 2012 PTCA of PLVB 12/21/10; 27 Dec 2010. PCI and Resolute DES to OM2 03/24/13 , cath with patent LTA and SVG, the OM stenosis was distal to the graft, EF 60% Last OP visit 07/05/2014:  [name] presents for follow up for CAD.  Assessment  . History of a CABG was done February 03, 2009  CAD, multiple vessel Well Controlled (414.00) (I25.10);  PCI and Resolute DES to  . Costochondral chest pain 11/10/2015  . DDD (degenerative disc disease), lumbar 07/30/2016  . Drug therapy 01/15/2016  . Hepatic steatosis 01/15/2016  . Hyperlipidemia 11/10/2015   Overview:  poorly statin tolerant, he is on a low intensity statin with a LDL>100   . 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 Migraine 01/15/2016  . Mixed emotional features as adjustment reaction 01/15/2016  . Obesity (BMI 30-39.9) 01/15/2016  . Type 2 diabetes mellitus with stage 1 chronic kidney disease, without long-term current use of insulin (HCC) 01/15/2016  . Vitamin D deficiency 01/15/2016    Past Surgical History:  Procedure Laterality Date  . CARDIAC CATHETERIZATION    . CORONARY ANGIOPLASTY    . CORONARY ARTERY BYPASS GRAFT    . SPINE SURGERY      Current Medications: Current Meds  Medication Sig  . aspirin EC 81 MG tablet Take 81 mg by mouth daily.   Marland Kitchen atorvastatin (LIPITOR) 40 MG tablet Take 40 mg by mouth daily.  . B-D ULTRA-FINE 33 LANCETS MISC by Misc.(Non-Drug; Combo Route) route.  . Blood Glucose Monitoring Suppl (GLUCOCOM BLOOD GLUCOSE MONITOR) DEVI by Misc.(Non-Drug; Combo Route) route.  . clopidogrel (PLAVIX) 75 MG tablet Take 75 mg by mouth daily.   . DOCOSAHEXAENOIC ACID PO Take 1 g by mouth daily.   Marland Kitchen glimepiride (AMARYL) 2 MG tablet TAKE  1 TABLET (2 MG TOTAL) BY MOUTH 2 TIMES DAILY BEFORE MEALS.  Marland Kitchen lisinopril (PRINIVIL,ZESTRIL) 20 MG tablet Take 20 mg by mouth daily.   . metFORMIN (GLUCOPHAGE) 1000 MG tablet TAKE 1 TABLET BY MOUTH TWICE A DAY WITH MEALS FOR DIABITIES  . nitroGLYCERIN (NITROSTAT) 0.4 MG SL tablet Place 1 tablet (0.4 mg total) under the tongue every 5 (five) minutes as needed.  . ranolazine (RANEXA) 500 MG 12 hr tablet Take 1 tablet (500 mg total) by mouth 2 (two) times daily.  . Vitamin D, Ergocalciferol, (DRISDOL) 50000 units CAPS capsule Take 50,000 Units by mouth every  7 (seven) days.   . [DISCONTINUED] rosuvastatin (CRESTOR) 20 MG tablet Take 20 mg by mouth daily.     Allergies:   Dulaglutide   Social History   Socioeconomic History  . Marital status: Married    Spouse name: Not on file  . Number of children: Not on file  . Years of education: Not on file  . Highest education level: Not on file  Occupational History  . Not on file  Social Needs  . Financial resource strain: Not on file  . Food insecurity    Worry: Not on file    Inability: Not on file  . Transportation needs    Medical: Not on file    Non-medical: Not on file  Tobacco Use  . Smoking status: Never Smoker  . Smokeless tobacco: Never Used  Substance and Sexual Activity  . Alcohol use: No    Frequency: Never  . Drug use: No  . Sexual activity: Not on file  Lifestyle  . Physical activity    Days per week: Not on file    Minutes per session: Not on file  . Stress: Not on file  Relationships  . Social Herbalist on phone: Not on file    Gets together: Not on file    Attends religious service: Not on file    Active member of club or organization: Not on file    Attends meetings of clubs or organizations: Not on file    Relationship status: Not on file  Other Topics Concern  . Not on file  Social History Narrative  . Not on file     Family History: The patient's family history includes Diabetes in his brother and sister; Diabetes type I in his son; Hypertension in his brother.  ROS:   Review of Systems  Constitution: Negative for decreased appetite, fever and weight gain.  HENT: Negative for congestion, ear discharge, hoarse voice and sore throat.   Eyes: Negative for discharge, redness, vision loss in right eye and visual halos.  Cardiovascular: Negative for chest pain, dyspnea on exertion, leg swelling, orthopnea and palpitations.  Respiratory: Negative for cough, hemoptysis, shortness of breath and snoring.   Endocrine: Negative for heat intolerance  and polyphagia.  Hematologic/Lymphatic: Negative for bleeding problem. Does not bruise/bleed easily.  Skin: Negative for flushing, nail changes, rash and suspicious lesions.  Musculoskeletal: Negative for arthritis, joint pain, muscle cramps, myalgias, neck pain and stiffness.  Gastrointestinal: Negative for abdominal pain, bowel incontinence, diarrhea and excessive appetite.  Genitourinary: Negative for decreased libido, genital sores and incomplete emptying.  Neurological: Negative for brief paralysis, focal weakness, headaches and loss of balance.  Psychiatric/Behavioral: Negative for altered mental status, depression and suicidal ideas.  Allergic/Immunologic: Negative for HIV exposure and persistent infections.    EKGs/Labs/Other Studies Reviewed:    The following studies were reviewed today:  EKG: None performed today.  EKG performed on 10 14 2020 report sinus bradycardia with arrhythmia inverted lead I and aVL which is likely lead reversal after review of previous EKG. Sinus rhythm with arrhythmia heart rate 62 bpm performed on January 26, 2019.  E   Pharmacologic nuclear stress test performed on 11/07/2019 at [facility] reported small fixed perfusion defect in the anterior and apical wall.  Possibly reflecting prior myocardial infarction.  No reversible ischemia noted.  LVEF 61% normal wall motion.   Recent Labs: No results found for requested labs within last 8760 hours.  Recent Lipid Panel No results found for: CHOL, TRIG, HDL, CHOLHDL, VLDL, LDLCALC, LDLDIRECT  Physical Exam:    VS:  BP 112/70 (BP Location: Right Arm, Patient Position: Sitting, Cuff Size: Normal)   Pulse 62   Ht  (1.676 m)   Wt 190 lb (86.2 kg)   SpO2 97%   BMI 30.67 kg/m     Wt Readings from Last 3 Encounters:  09/13/19 190 lb (86.2 kg)  01/26/19 194 lb (88 kg)  12/05/17 197 lb (89.4 kg)     GEN: Well nourished, well developed in no acute distress HEENT: Normal NECK: No JVD; No  carotid bruits LYMPHATICS: No lymphadenopathy CARDIAC: S1S2 noted,RRR, no murmurs, rubs, gallops RESPIRATORY:  Clear to auscultation without rales, wheezing or rhonchi  ABDOMEN: Soft, non-tender, non-distended, +bowel sounds, no guarding. EXTREMITIES: No edema, No cyanosis, no clubbing MUSCULOSKELETAL:  No edema; No deformity  SKIN: Warm and dry NEUROLOGIC:  Alert and oriented x 3, non-focal PSYCHIATRIC:  Normal affect, good insight  ASSESSMENT:    1. Coronary artery disease involving coronary bypass graft of native heart without angina pectoris   2. Type 2 diabetes mellitus without complication, with long-term current use of insulin (HCC)   3. Obesity (BMI 30.0-34.9)   4. Mixed hyperlipidemia    PLAN:    Patient with resolved angina and unremarkable stress test performed on September 08, 2019. For now he will continue his current medication regimen.  I have advised patient to continue his as needed nitroglycerin.  We have educated him on a protocol of using this medication as well as advised the patient to call 911 in the setting of recurrent symptoms.  For this patient if he does have recurrent anginal symptoms I would favor a left heart catheterization as he does have high risk for aggression of his coronary disease.   The patient is in agreement with the above plan. The patient left the office in stable condition.  The patient will follow up in 3 months.   Medication Adjustments/Labs and Tests Ordered: Current medicines are reviewed at length with the patient today.  Concerns regarding medicines are outlined above.  No orders of the defined types were placed in this encounter.  No orders of the defined types were placed in this encounter.   Patient Instructions  Medication Instructions:  Your physician recommends that you continue on your current medications as directed. Please refer to the Current Medication list given to you today.  *If you need a refill on your cardiac  medications before your next appointment, please call your pharmacy*  Lab Work: None If you have labs (blood work) drawn today and your tests are completely normal, you will receive your results only by: Marland Kitchen MyChart Message (if you have MyChart) OR . A paper copy in the mail If you have any lab test that is abnormal or we need to change your treatment, we will  call you to review the results.  Testing/Procedures: None  Follow-Up: At [facility], you and your health needs are our priority.  As part of our continuing mission to provide you with exceptional heart care, we have created designated Provider Care Teams.  These Care Teams include your primary Cardiologist (physician) and Advanced Practice Providers (APPs -  Physician Assistants and Nurse Practitioners) who all work together to provide you with the care you need, when you need it.  Your next appointment:   3 months  The format for your next appointment:   In Person  Provider:   Norman HerrlichBrian Munley, MD or Dr Servando Sal[place]obb  Other Instructions      Adopting a Healthy Lifestyle.  Know what a healthy weight is for you (roughly BMI <25) and aim to maintain this   Aim for 7+ servings of fruits and vegetables daily   65-80+ fluid ounces of water or unsweet tea for healthy kidneys   Limit to max 1 drink of alcohol per day; avoid smoking/tobacco   Limit animal fats in diet for cholesterol and heart health - choose grass fed whenever available   Avoid highly processed foods, and foods high in saturated/trans fats   Aim for low stress - take time to unwind and care for your mental health   Aim for 150 min of moderate intensity exercise weekly for heart health, and weights twice weekly for bone health   Aim for 7-9 hours of sleep daily   When it comes to diets, agreement about the perfect plan isnt easy to find, even among the experts. Experts at the [employer] developed an idea known as the Healthy Eating Plate.  Just imagine a plate divided into logical, healthy portions.   The emphasis is on diet quality:   Load up on vegetables and fruits - one-half of your plate: Aim for color and variety, and remember that potatoes dont count.   Go for whole grains - one-quarter of your plate: Whole wheat, barley, wheat berries, quinoa, oats, brown rice, and foods made with them. If you want pasta, go with whole wheat pasta.   Protein power - one-quarter of your plate: Fish, chicken, beans, and nuts are all healthy, versatile protein sources. Limit red meat.   The diet, however, does go beyond the plate, offering a few other suggestions.   Use healthy plant oils, such as olive, canola, soy, corn, sunflower and peanut. Check the labels, and avoid partially hydrogenated oil, which have unhealthy trans fats.   If youre thirsty, drink water. Coffee and tea are good in moderation, but skip sugary drinks and limit milk and dairy products to one or two daily servings.   The type of carbohydrate in the diet is more important than the amount. Some sources of carbohydrates, such as vegetables, fruits, whole grains, and beans-are healthier than others.   Finally, stay active  Signed, Thomasene RippleKardie Krisha Beegle, DO  09/13/2019 2:26 PM    [facility]
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
## 2019-10-25 ENCOUNTER — Other Ambulatory Visit: Payer: Self-pay | Admitting: Cardiology

## 2019-12-25 NOTE — Progress Notes (Signed)
Cardiology Office Note:    Date:  12/27/2019   ID:  [name], DOB January 28, 1966, MRN [id number]  PCP:  Kevin Cherry, MD  Cardiologist:  Kevin Herrlich, MD    Referring MD: Kevin Cherry, MD    ASSESSMENT:    1. Coronary artery disease involving coronary bypass graft of native heart without angina pectoris   2. Type 2 diabetes mellitus without complication, with long-term current use of insulin (HCC)   3. Mixed hyperlipidemia   4. 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 CAD is stable new medical therapy including long-term dual antiplatelet high intensity statin and ranolazine.  If he is having typical angina plan will be referral to coronary angiography.  He is now 9 years remote from bypass. 2. Labile hypertension managed on oral agents by his primary care physician 3. Stable dyslipidemia continue high intensity statin 4. Pressure at target continue beta-blocker   Next appointment: 6 months   Medication Adjustments/Labs and Tests Ordered: Current medicines are reviewed at length with the patient today.  Concerns regarding medicines are outlined above.  No orders of the defined types were placed in this encounter.  No orders of the defined types were placed in this encounter.   Chief Complaint  Patient presents with  . Follow-up  . Coronary Artery Disease    History of Present Illness:    [name] is a 54 y.o. male with a hx of CAD status post CABG in 2012, subsequent PCI in 2012 in 2014, Dyslipidemia, HTN, and type 2 diabetes. He was  last seen 09/13/2019 after he was admitted at [facility] after presenting with chest pain. During his hospitalization with imaging studies were performed and this was unremarkable therefore patient was discharged and recommended to see his cardiologist.  Most recent lipid profile 09/06/2019: Cholesterol 141 HDL  29 LDL 107 triglyceride 141 remainder of CMP is normal except for glucose 145 GFR greater than 90 cc and liver function test were normal.  Compliance with diet, lifestyle and medications: Yes  Is a bit paradoxical he says he feels great he is active physical work has had no angina he does have chronic chest wall pain since bypass surgery.  We reviewed his presentation at [facility] health discussed the potential for coronary angiography but neither of [place] feel is indicated at this time with stable symptoms and a normal myocardial perfusion study.  I did encourage him that if he wonders if he needs nitroglycerin to sit down and take it.  He has had no palpitation edema shortness of breath or syncope. Past Medical History:  Diagnosis Date  . 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 CAD, multiple vessel 01/15/2016   Overview:  PCI and Resolute DES to OM2 03/24/2013.    Marland Kitchen Cholelithiasis with chronic cholecystitis without biliary obstruction 01/15/2016  . Chronic midline low back pain 01/15/2016  . CKD stage G1/A1, GFR > 90 and albumin creatinine ratio <30 mg/g 01/15/2016  . Coronary artery disease involving native coronary artery of native heart with angina pectoris (HCC) 11/10/2015   Overview:  Overview:  CABG 2012 PTCA of PLVB 12/21/10; 27 Dec 2010. PCI and Resolute DES to OM2 03/24/13 , cath with patent LTA and SVG, the OM stenosis was distal to the graft, EF 60% Last OP visit 07/05/2014:  Kevin Maus  Hensley presents for follow up for CAD.  Assessment  . History of a CABG was done February 03, 2009  CAD, multiple vessel Well Controlled (414.00) (I25.10); PCI and Resolute DES to  . Costochondral chest pain 11/10/2015  . DDD (degenerative disc disease), lumbar 07/30/2016  . Drug therapy 01/15/2016  . Hepatic steatosis 01/15/2016  . Hyperlipidemia 11/10/2015   Overview:  poorly statin tolerant, he is on a low intensity statin with a LDL>100   . Hypertensive heart and kidney  disease without heart failure and with chronic kidney disease stage I 01/15/2016  . Migraine 01/15/2016  . Mixed emotional features as adjustment reaction 01/15/2016  . Obesity (BMI 30-39.9) 01/15/2016  . Type 2 diabetes mellitus with stage 1 chronic kidney disease, without long-term current use of insulin (HCC) 01/15/2016  . Vitamin D deficiency 01/15/2016    Past Surgical History:  Procedure Laterality Date  . CARDIAC CATHETERIZATION    . CORONARY ANGIOPLASTY    . CORONARY ARTERY BYPASS GRAFT    . SPINE SURGERY      Current Medications: Current Meds  Medication Sig  . aspirin EC 81 MG tablet Take 81 mg by mouth daily.   Marland Kitchen atorvastatin (LIPITOR) 40 MG tablet Take 40 mg by mouth daily.  . B-D ULTRA-FINE 33 LANCETS MISC by Misc.(Non-Drug; Combo Route) route.  . Blood Glucose Monitoring Suppl (GLUCOCOM BLOOD GLUCOSE MONITOR) DEVI by Misc.(Non-Drug; Combo Route) route.  . clopidogrel (PLAVIX) 75 MG tablet Take 75 mg by mouth daily.   Marland Kitchen glimepiride (AMARYL) 2 MG tablet TAKE 1 TABLET (2 MG TOTAL) BY MOUTH 2 TIMES DAILY BEFORE MEALS.  Marland Kitchen lisinopril (PRINIVIL,ZESTRIL) 20 MG tablet Take 20 mg by mouth daily.   . metFORMIN (GLUCOPHAGE) 1000 MG tablet TAKE 1 TABLET BY MOUTH TWICE A DAY WITH MEALS FOR DIABITIES  . nitroGLYCERIN (NITROSTAT) 0.4 MG SL tablet Place 1 tablet (0.4 mg total) under the tongue every 5 (five) minutes as needed.  . ranolazine (RANEXA) 500 MG 12 hr tablet TAKE 1 TABLET BY MOUTH TWICE A DAY  . Vitamin D, Ergocalciferol, (DRISDOL) 50000 units CAPS capsule Take 50,000 Units by mouth every 7 (seven) days.      Allergies:   Dulaglutide   Social History   Socioeconomic History  . Marital status: Married    Spouse name: Not on file  . Number of children: Not on file  . Years of education: Not on file  . Highest education level: Not on file  Occupational History  . Not on file  Tobacco Use  . Smoking status: Never Smoker  . Smokeless tobacco: Never Used  Substance and  Sexual Activity  . Alcohol use: No  . Drug use: No  . Sexual activity: Not on file  Other Topics Concern  . Not on file  Social History Narrative  . Not on file   Social Determinants of Health   Financial Resource Strain:   . Difficulty of Paying Living Expenses: Not on file  Food Insecurity:   . Worried About Programme researcher, broadcasting/film/video in the Last Year: Not on file  . Ran Out of Food in the Last Year: Not on file  Transportation Needs:   . Lack of Transportation (Medical): Not on file  . Lack of Transportation (Non-Medical): Not on file  Physical Activity:   . Days of Exercise per Week: Not on file  . Minutes of Exercise per Session: Not on file  Stress:   . Feeling of Stress : Not on file  Social Connections:   . Frequency of Communication with Friends and Family: Not on file  . Frequency of Social Gatherings with Friends and Family: Not on file  . Attends Religious Services: Not on file  . Active Member of Clubs or Organizations: Not on file  . Attends Archivist Meetings: Not on file  . Marital Status: Not on file     Family History: The patient's family history includes Diabetes in his brother and sister; Diabetes type I in his son; Hypertension in his brother. ROS:   Please see the history of present illness.    All other systems reviewed and are negative.  EKGs/Labs/Other Studies Reviewed:    The following studies were reviewed today:  EKG:  EKG ordered today and personally reviewed.  The ekg ordered today demonstrates sinus rhythm T wave inversion laterally  Recent Labs: No results found for requested labs within last 8760 hours.  Recent Lipid Panel No results found for: CHOL, TRIG, HDL, CHOLHDL, VLDL, LDLCALC, LDLDIRECT  Physical Exam:    VS:  BP 134/84   Pulse 69   Ht 5\' 6"  (1.676 m)   Wt 193 lb 3.2 oz (87.6 kg)   SpO2 98%   BMI 31.18 kg/m     Wt Readings from Last 3 Encounters:  12/27/19 193 lb 3.2 oz (87.6 kg)  09/13/19 190 lb (86.2 kg)    01/26/19 194 lb (88 kg)     GEN:  Well nourished, well developed in no acute distress HEENT: Normal NECK: No JVD; No carotid bruits LYMPHATICS: No lymphadenopathy CARDIAC: RRR, no murmurs, rubs, gallops RESPIRATORY:  Clear to auscultation without rales, wheezing or rhonchi  ABDOMEN: Soft, non-tender, non-distended MUSCULOSKELETAL:  No edema; No deformity  SKIN: Warm and dry NEUROLOGIC:  Alert and oriented x 3 PSYCHIATRIC:  Normal affect    Signed, Shirlee More, MD  12/27/2019 10:52 AM    [place]

## 2019-12-27 ENCOUNTER — Encounter: Payer: Self-pay | Admitting: Cardiology

## 2019-12-27 ENCOUNTER — Ambulatory Visit (INDEPENDENT_AMBULATORY_CARE_PROVIDER_SITE_OTHER): Payer: BC Managed Care – PPO | Admitting: Cardiology

## 2019-12-27 ENCOUNTER — Other Ambulatory Visit: Payer: Self-pay

## 2019-12-27 VITALS — BP 134/84 | HR 69 | Ht 66.0 in | Wt 193.2 lb

## 2019-12-27 DIAGNOSIS — E782 Mixed hyperlipidemia: Secondary | ICD-10-CM

## 2019-12-27 DIAGNOSIS — E119 Type 2 diabetes mellitus without complications: Secondary | ICD-10-CM | POA: Diagnosis not present

## 2019-12-27 DIAGNOSIS — Z794 Long term (current) use of insulin: Secondary | ICD-10-CM

## 2019-12-27 DIAGNOSIS — I1310 Hypertensive heart and chronic kidney disease without heart failure, with stage 1 through stage 4 chronic kidney disease, or unspecified chronic kidney disease: Secondary | ICD-10-CM | POA: Diagnosis not present

## 2019-12-27 DIAGNOSIS — I25810 Atherosclerosis of coronary artery bypass graft(s) without angina pectoris: Secondary | ICD-10-CM | POA: Diagnosis not present

## 2019-12-27 DIAGNOSIS — N181 Chronic kidney disease, stage 1: Secondary | ICD-10-CM

## 2019-12-27 NOTE — Patient Instructions (Signed)
Medication Instructions:  Your physician recommends that you continue on your current medications as directed. Please refer to the Current Medication list given to you today.  *If you need a refill on your cardiac medications before your next appointment, please call your pharmacy*  Lab Work: NONE If you have labs (blood work) drawn today and your tests are completely normal, you will receive your results only by: . MyChart Message (if you have MyChart) OR . A paper copy in the mail If you have any lab test that is abnormal or we need to change your treatment, we will call you to review the results.  Testing/Procedures: You had an EKG performed today  Follow-Up: At [facility], you and your health needs are our priority.  As part of our continuing mission to provide you with exceptional heart care, we have created designated Provider Care Teams.  These Care Teams include your primary Cardiologist (physician) and Advanced Practice Providers (APPs -  Physician Assistants and Nurse Practitioners) who all work together to provide you with the care you need, when you need it.  Your next appointment:   6 month(s)  The format for your next appointment:   In Person  Provider:   Brian Munley, MD    

## 2020-01-27 ENCOUNTER — Other Ambulatory Visit: Payer: Self-pay | Admitting: Cardiology

## 2020-09-03 NOTE — Progress Notes (Signed)
Cardiology Office Note:    Date:  09/04/2020   ID:  [name], DOB 04-07-1966, MRN [id number]  PCP:  Everlean Cherry, MD  Cardiologist:  Norman Herrlich, MD    Referring MD: Everlean Cherry, MD    ASSESSMENT:    1. Coronary artery disease involving coronary bypass graft of native heart without angina pectoris   2. Mixed hyperlipidemia   3. 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 Stable CAD following remote revascularization doing well and not having exertional angina on current treatment continue aspirin clopidogrel ranolazine and statin.  I will access his records [facility] health MD if he had a reassuring perfusion study I would not repeat an ischemia evaluation at this time 2. BP at target continue current treatment ACE inhibitor 3. Continue his statin 4. Stable diabetes A1c remains above target managed by his PCP  Next appointment: 1 year   Medication Adjustments/Labs and Tests Ordered: Current medicines are reviewed at length with the patient today.  Concerns regarding medicines are outlined above.  No orders of the defined types were placed in this encounter.  No orders of the defined types were placed in this encounter.   Chief Complaint  Patient presents with  . Follow-up  . Coronary Artery Disease    History of Present Illness:    [name] is a 54 y.o. male with a hx of coronary artery disease, CABG in 2012, subsequent PCI in 2012 and 2014 dyslipidemia hypertension and type 2 diabetes.  He was last seen 12/27/2019. Compliance with diet, lifestyle and medications: Yes  Reviewed his labs with him. In general he is doing well he has chest pain which is chest wall chronic since his surgery but rarely in a hot humid day when he walks outdoors he has typical angina relieved with rest and has not needed nitroglycerin. I requested the records he tells  me he is at [facility] last October chest pain stayed overnight cardiac enzymes normal and had a stress test reported to and is normal.  Prior to completing this note I will access those records. He continues to be active he exercises daily and is not having shortness of breath palpitation or syncope.  He tolerates his statin without muscle pain or weakness  Recent labs 07/26/2020 PCP [facility], CMP is normal except for glucose 126 his potassium was 4.5 GFR greater than 90 cc liver function normal cholesterol 141 LDL 107 triglycerides 141 HDL 29 A1c 7.6%  Records received from [facility].  09/07/2019 underwent a myocardial perfusion study showing a small fixed defect possible previous infarction although he had normal left ventricular function EF 61% he had no ischemia.  In my opinion this is most consistent with continuation.  His EKG showed sinus rhythm chest x-ray findings of CABG otherwise normal CBC was normal serial troponins were undetectable creatinine was 0.7 GFR greater than 60 cc. Past Medical History:  Diagnosis Date  . 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 CAD, multiple vessel 01/15/2016   Overview:  PCI and Resolute DES to OM2 03/24/2013.    Marland Kitchen Cholelithiasis with chronic cholecystitis without biliary obstruction 01/15/2016  . Chronic midline low back pain 01/15/2016  . CKD stage G1/A1, GFR > 90 and albumin creatinine ratio <30 mg/g 01/15/2016  . Coronary artery disease involving native  coronary artery of native heart with angina pectoris (HCC) 11/10/2015   Overview:  Overview:  CABG 2012 PTCA of PLVB 12/21/10; 27 Dec 2010. PCI and Resolute DES to OM2 03/24/13 , cath with patent LTA and SVG, the OM stenosis was distal to the graft, EF 60% Last OP visit 07/05/2014:  [name] presents for follow up for CAD.  Assessment  . History of a CABG was done February 03, 2009  CAD, multiple vessel Well Controlled (414.00) (I25.10); PCI  and Resolute DES to  . Costochondral chest pain 11/10/2015  . DDD (degenerative disc disease), lumbar 07/30/2016  . Drug therapy 01/15/2016  . Hepatic steatosis 01/15/2016  . Hyperlipidemia 11/10/2015   Overview:  poorly statin tolerant, he is on a low intensity statin with a LDL>100   . 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 Migraine 01/15/2016  . Mixed emotional features as adjustment reaction 01/15/2016  . Obesity (BMI 30-39.9) 01/15/2016  . Type 2 diabetes mellitus with stage 1 chronic kidney disease, without long-term current use of insulin (HCC) 01/15/2016  . Vitamin D deficiency 01/15/2016    Past Surgical History:  Procedure Laterality Date  . CARDIAC CATHETERIZATION    . CORONARY ANGIOPLASTY    . CORONARY ARTERY BYPASS GRAFT    . SPINE SURGERY      Current Medications: Current Meds  Medication Sig  . aspirin EC 81 MG tablet Take 81 mg by mouth daily.   Marland Kitchen atorvastatin (LIPITOR) 40 MG tablet Take 40 mg by mouth daily.  . B-D ULTRA-FINE 33 LANCETS MISC by Misc.(Non-Drug; Combo Route) route.  . Blood Glucose Monitoring Suppl (GLUCOCOM BLOOD GLUCOSE MONITOR) DEVI by Misc.(Non-Drug; Combo Route) route.  . clopidogrel (PLAVIX) 75 MG tablet Take 75 mg by mouth daily.   . DOCOSAHEXAENOIC ACID PO Take 1 g by mouth daily.   Marland Kitchen glimepiride (AMARYL) 2 MG tablet TAKE 1 TABLET (2 MG TOTAL) BY MOUTH 2 TIMES DAILY BEFORE MEALS.  Marland Kitchen lisinopril (PRINIVIL,ZESTRIL) 20 MG tablet Take 20 mg by mouth daily.   . metFORMIN (GLUCOPHAGE) 1000 MG tablet TAKE 1 TABLET BY MOUTH TWICE A DAY WITH MEALS FOR DIABITIES  . nitroGLYCERIN (NITROSTAT) 0.4 MG SL tablet Place 1 tablet (0.4 mg total) under the tongue every 5 (five) minutes as needed.  . ranolazine (RANEXA) 500 MG 12 hr tablet TAKE 1 TABLET BY MOUTH TWICE A DAY  . Vitamin D, Ergocalciferol, (DRISDOL) 50000 units CAPS capsule Take 50,000 Units by mouth every 7 (seven) days.       Allergies:   Dulaglutide   Social History   Socioeconomic History  . Marital status: Married    Spouse name: Not on file  . Number of children: Not on file  . Years of education: Not on file  . Highest education level: Not on file  Occupational History  . Not on file  Tobacco Use  . Smoking status: Never Smoker  . Smokeless tobacco: Never Used  Vaping Use  . Vaping Use: Never used  Substance and Sexual Activity  . Alcohol use: No  . Drug use: No  . Sexual activity: Not on file  Other Topics Concern  . Not on file  Social History Narrative  . Not on file   Social Determinants of Health   Financial Resource Strain:   . Difficulty of Paying Living Expenses: Not on file  Food Insecurity:   . Worried About Programme researcher, broadcasting/film/video in the  Last Year: Not on file  . Ran Out of Food in the Last Year: Not on file  Transportation Needs:   . Lack of Transportation (Medical): Not on file  . Lack of Transportation (Non-Medical): Not on file  Physical Activity:   . Days of Exercise per Week: Not on file  . Minutes of Exercise per Session: Not on file  Stress:   . Feeling of Stress : Not on file  Social Connections:   . Frequency of Communication with Friends and Family: Not on file  . Frequency of Social Gatherings with Friends and Family: Not on file  . Attends Religious Services: Not on file  . Active Member of Clubs or Organizations: Not on file  . Attends Banker Meetings: Not on file  . Marital Status: Not on file     Family History: The patient's family history includes Diabetes in his brother and sister; Diabetes type I in his son; Hypertension in his brother. ROS:   Please see the history of present illness.    All other systems reviewed and are negative.  EKGs/Labs/Other Studies Reviewed:    The following studies were reviewed today:  EKG:  EKG ordered today and personally reviewed.  The ekg ordered today demonstrates sinus rhythm old inferior MI  nonspecific ST-T abnormality    Physical Exam:    VS:  BP (!) 145/83   Pulse 72   Ht 5\' 6"  (1.676 m)   Wt 193 lb 9.6 oz (87.8 kg)   SpO2 98%   BMI 31.25 kg/m     Wt Readings from Last 3 Encounters:  09/04/20 193 lb 9.6 oz (87.8 kg)  12/27/19 193 lb 3.2 oz (87.6 kg)  09/13/19 190 lb (86.2 kg)     GEN:  Well nourished, well developed in no acute distress HEENT: Normal NECK: No JVD; No carotid bruits LYMPHATICS: No lymphadenopathy CARDIAC: RRR, no murmurs, rubs, gallops RESPIRATORY:  Clear to auscultation without rales, wheezing or rhonchi  ABDOMEN: Soft, non-tender, non-distended MUSCULOSKELETAL:  No edema; No deformity  SKIN: Warm and dry NEUROLOGIC:  Alert and oriented x 3 PSYCHIATRIC:  Normal affect    Signed, 09/15/19, MD  09/04/2020 2:58 PM    [facility]

## 2020-09-04 ENCOUNTER — Other Ambulatory Visit: Payer: Self-pay

## 2020-09-04 ENCOUNTER — Encounter: Payer: Self-pay | Admitting: Cardiology

## 2020-09-04 ENCOUNTER — Ambulatory Visit (INDEPENDENT_AMBULATORY_CARE_PROVIDER_SITE_OTHER): Payer: Self-pay | Admitting: Cardiology

## 2020-09-04 VITALS — BP 145/83 | HR 72 | Ht 66.0 in | Wt 193.6 lb

## 2020-09-04 DIAGNOSIS — I1310 Hypertensive heart and chronic kidney disease without heart failure, with stage 1 through stage 4 chronic kidney disease, or unspecified chronic kidney disease: Secondary | ICD-10-CM

## 2020-09-04 DIAGNOSIS — I25810 Atherosclerosis of coronary artery bypass graft(s) without angina pectoris: Secondary | ICD-10-CM

## 2020-09-04 DIAGNOSIS — N181 Chronic kidney disease, stage 1: Secondary | ICD-10-CM

## 2020-09-04 DIAGNOSIS — Z23 Encounter for immunization: Secondary | ICD-10-CM

## 2020-09-04 DIAGNOSIS — E782 Mixed hyperlipidemia: Secondary | ICD-10-CM

## 2020-09-04 MED ORDER — NITROGLYCERIN 0.4 MG SL SUBL: 0.4000 mg | SUBLINGUAL_TABLET | SUBLINGUAL | 3 refills | Status: DC | PRN

## 2020-09-04 NOTE — Patient Instructions (Signed)

## 2020-10-23 ENCOUNTER — Other Ambulatory Visit: Payer: Self-pay | Admitting: Cardiology

## 2020-10-23 NOTE — Telephone Encounter (Signed)
Rx refill sent to pharmacy. 

## 2021-04-13 ENCOUNTER — Ambulatory Visit (INDEPENDENT_AMBULATORY_CARE_PROVIDER_SITE_OTHER): Payer: Self-pay | Admitting: Cardiology

## 2021-04-13 ENCOUNTER — Other Ambulatory Visit: Payer: Self-pay

## 2021-04-13 ENCOUNTER — Encounter: Payer: Self-pay | Admitting: Cardiology

## 2021-04-13 VITALS — BP 140/80 | HR 66 | Ht 66.0 in | Wt 187.0 lb

## 2021-04-13 DIAGNOSIS — I25810 Atherosclerosis of coronary artery bypass graft(s) without angina pectoris: Secondary | ICD-10-CM

## 2021-04-13 DIAGNOSIS — E782 Mixed hyperlipidemia: Secondary | ICD-10-CM

## 2021-04-13 DIAGNOSIS — N181 Chronic kidney disease, stage 1: Secondary | ICD-10-CM

## 2021-04-13 DIAGNOSIS — I1310 Hypertensive heart and chronic kidney disease without heart failure, with stage 1 through stage 4 chronic kidney disease, or unspecified chronic kidney disease: Secondary | ICD-10-CM

## 2021-04-13 DIAGNOSIS — Z794 Long term (current) use of insulin: Secondary | ICD-10-CM

## 2021-04-13 DIAGNOSIS — E119 Type 2 diabetes mellitus without complications: Secondary | ICD-10-CM

## 2021-04-13 NOTE — Progress Notes (Signed)
Cardiology Office Note:    Date:  04/13/2021   ID:  [name], DOB October 18, 1966, MRN [id number]  PCP:  Everlean Cherry, MD  Cardiologist:  Norman Herrlich, MD    Referring MD: Everlean Cherry, MD    ASSESSMENT:    1. Coronary artery disease involving coronary bypass graft of native heart without angina pectoris   2. 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 Mixed hyperlipidemia   4. Type 2 diabetes mellitus without complication, with long-term current use of insulin (HCC)    PLAN:    In order of problems listed above:  1. He is doing quite well has increased lifestyle changes having no angina following remote CABG and PCI's we will continue his medical regimen chronic dual antiplatelet therapy his statin lipids are at target and ranolazine which has been remarkably effective alleviating exertional angina 2. BP at target continue current treatment ACE inhibitor with his diabetes 3. High risk continue with statin 4. Stable managed by his PCP   Next appointment: 9 months   Medication Adjustments/Labs and Tests Ordered: Current medicines are reviewed at length with the patient today.  Concerns regarding medicines are outlined above.  Orders Placed This Encounter  Procedures  . EKG 12-Lead   No orders of the defined types were placed in this encounter.   Chief Complaint  Patient presents with  . Follow-up  . Coronary Artery Disease  . Hypertension  . Hyperlipidemia    History of Present Illness:    [name] is a 55 y.o. male with a hx of CAD CABG 2012 PCI 2012 after bypass surgery and in 2014 dyslipidemia hypertension and type 2 diabetes last seen 09/04/2020. Compliance with diet, lifestyle and medications: Yes  He really is not brisk lifestyle change he walks for an hour every day and is not having exertional angina He has had a chronic soreness in his sternum ever since he has had bypass surgery  that waxes and wanes. He has not required nitroglycerin since last visit No edema shortness of breath palpitation or syncope.  Recent labs [facility] PCP 04/03/2021 CMP with a potassium of 5.3 random glucose 172 creatinine 0.86 GFR greater than 90 cc Lipid profile cholesterol 155 LDL 118 triglycerides 208 HDL 29 Past Medical History:  Diagnosis Date  . 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 CAD, multiple vessel 01/15/2016   Overview:  PCI and Resolute DES to OM2 03/24/2013.    Marland Kitchen Cholelithiasis with chronic cholecystitis without biliary obstruction 01/15/2016  . Chronic midline low back pain 01/15/2016  . CKD stage G1/A1, GFR > 90 and albumin creatinine ratio <30 mg/g 01/15/2016  . Coronary artery disease involving native coronary artery of native heart with angina pectoris (HCC) 11/10/2015   Overview:  Overview:  CABG 2012 PTCA of PLVB 12/21/10; 27 Dec 2010. PCI and Resolute DES to OM2 03/24/13 , cath with patent LTA and SVG, the OM stenosis was distal to the graft, EF 60% Last OP visit 07/05/2014:  [name] presents for follow up for CAD.  Assessment  . History of a CABG was done February 03, 2009  CAD, multiple vessel Well Controlled (414.00) (I25.10); PCI and Resolute DES to  . Costochondral chest pain 11/10/2015  . DDD (degenerative disc disease), lumbar 07/30/2016  . Drug therapy 01/15/2016  . Hepatic steatosis 01/15/2016  . Hyperlipidemia 11/10/2015   Overview:  poorly statin tolerant, he is on a low intensity statin with a LDL>100   . 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 Migraine 01/15/2016  . Mixed emotional features as adjustment reaction 01/15/2016  . Obesity (BMI 30-39.9) 01/15/2016  . Type 2 diabetes mellitus with stage 1 chronic kidney disease, without long-term current use of insulin (HCC) 01/15/2016  . Vitamin D deficiency 01/15/2016    Past Surgical  History:  Procedure Laterality Date  . CARDIAC CATHETERIZATION    . CORONARY ANGIOPLASTY    . CORONARY ARTERY BYPASS GRAFT    . SPINE SURGERY      Current Medications: Current Meds  Medication Sig  . aspirin EC 81 MG tablet Take 81 mg by mouth daily.   Marland Kitchen atorvastatin (LIPITOR) 40 MG tablet Take 40 mg by mouth daily.  . B-D ULTRA-FINE 33 LANCETS MISC by Misc.(Non-Drug; Combo Route) route.  . Blood Glucose Monitoring Suppl (GLUCOCOM BLOOD GLUCOSE MONITOR) DEVI by Misc.(Non-Drug; Combo Route) route.  . clopidogrel (PLAVIX) 75 MG tablet Take 75 mg by mouth daily.   . DOCOSAHEXAENOIC ACID PO Take 1 g by mouth daily.   Marland Kitchen glimepiride (AMARYL) 2 MG tablet TAKE 1 TABLET (2 MG TOTAL) BY MOUTH 2 TIMES DAILY BEFORE MEALS.  Marland Kitchen lisinopril (PRINIVIL,ZESTRIL) 20 MG tablet Take 20 mg by mouth daily.   . metFORMIN (GLUCOPHAGE) 1000 MG tablet TAKE 1 TABLET BY MOUTH TWICE A DAY WITH MEALS FOR DIABITIES  . nitroGLYCERIN (NITROSTAT) 0.4 MG SL tablet Place 1 tablet (0.4 mg total) under the tongue every 5 (five) minutes as needed.  . ranolazine (RANEXA) 500 MG 12 hr tablet TAKE 1 TABLET BY MOUTH TWICE A DAY  . Vitamin D, Ergocalciferol, (DRISDOL) 50000 units CAPS capsule Take 50,000 Units by mouth every 7 (seven) days.      Allergies:   Dulaglutide   Social History   Socioeconomic History  . Marital status: Married    Spouse name: Not on file  . Number of children: Not on file  . Years of education: Not on file  . Highest education level: Not on file  Occupational History  . Not on file  Tobacco Use  . Smoking status: Never Smoker  . Smokeless tobacco: Never Used  Vaping Use  . Vaping Use: Never used  Substance and Sexual Activity  . Alcohol use: No  . Drug use: No  . Sexual activity: Not on file  Other Topics Concern  . Not on file  Social History Narrative  . Not on file   Social Determinants of Health   Financial Resource Strain: Not on file  Food Insecurity: Not on file   Transportation Needs: Not on file  Physical Activity: Not on file  Stress: Not on file  Social Connections: Not on file     Family History: The patient's family history includes Diabetes in his brother and sister; Diabetes type I in his son; Hypertension in his brother. ROS:   Please see the history of present illness.    All other systems reviewed and are negative.  EKGs/Labs/Other Studies Reviewed:    The following studies were reviewed today:  EKG:  EKG ordered today and personally reviewed.  The ekg ordered today demonstrates sinus rhythm old inferior infarction nonspecific T waves    Physical Exam:    VS:  BP 140/80 (BP Location: Right Arm, Patient Position: Sitting, Cuff Size: Normal)   Pulse 66   Ht 5'  6" (1.676 m)   Wt 187 lb (84.8 kg)   SpO2 97%   BMI 30.18 kg/m     Wt Readings from Last 3 Encounters:  04/13/21 187 lb (84.8 kg)  09/04/20 193 lb 9.6 oz (87.8 kg)  12/27/19 193 lb 3.2 oz (87.6 kg)     GEN:  Well nourished, well developed in no acute distress HEENT: Normal NECK: No JVD; No carotid bruits LYMPHATICS: No lymphadenopathy CARDIAC: RRR, no murmurs, rubs, gallops RESPIRATORY:  Clear to auscultation without rales, wheezing or rhonchi  ABDOMEN: Soft, non-tender, non-distended MUSCULOSKELETAL:  No edema; No deformity  SKIN: Warm and dry NEUROLOGIC:  Alert and oriented x 3 PSYCHIATRIC:  Normal affect    Signed, Norman Herrlich, MD  04/13/2021 1:37 PM    [facility]

## 2021-04-13 NOTE — Patient Instructions (Signed)

## 2021-07-15 ENCOUNTER — Other Ambulatory Visit: Payer: Self-pay | Admitting: Cardiology

## 2022-01-24 NOTE — Progress Notes (Signed)
?Cardiology Office Note:   ? ?Date:  01/25/2022  ? ?ID:  [name], DOB [birth year], MRN [id number] ? ?PCP:  Maris Berger, MD  ?Cardiologist:  Shirlee More, MD   ? ?Referring MD: Maris Berger, MD  ? ? ?ASSESSMENT:   ? ?1. Coronary artery disease involving native coronary artery of native heart with angina pectoris ([place])   ?2. 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 Mixed hyperlipidemia   ?4. Type 2 diabetes mellitus without complication, with long-term current use of insulin ([place])   ? ?PLAN:   ? ?In order of problems listed above: ? ?Stable CAD he has no anginal discomfort following remote CABG and PCI on current medical regimen including aspirin clopidogrel high intensity statin ranolazine and nitroglycerin.  I would not advise an ischemia evaluation stable chronic CAD ?Stable continue treatment including ACE inhibitor ?Continue his high intensity statin he is 10 months from his last lipid profile check today and also screen for lipoprotein a 93% effective treatment Olpasirin with severely elevated LP(a) with CAD ?Stable A1c remains above target ? ? ?Next appointment: 1 year ? ? ?Medication Adjustments/Labs and Tests Ordered: ?Current medicines are reviewed at length with the patient today.  Concerns regarding medicines are outlined above.  ?Orders Placed This Encounter  ?Procedures  ? EKG 12-Lead  ? ?Meds ordered this encounter  ?Medications  ? nitroGLYCERIN (NITROSTAT) 0.4 MG SL tablet  ?  Sig: Place 1 tablet (0.4 mg total) under the tongue every 5 (five) minutes as needed.  ?  Dispense:  25 tablet  ?  Refill:  3  ? ? ?Chief Complaint  ?Patient presents with  ? Follow-up  ? Coronary Artery Disease  ? ? ?History of Present Illness:   ? ?[name] is a 56 y.o. male with a hx of CAD CABG 2012 PCI 2012 after bypass surgery and in 2014 dyslipidemia hypertension and type 2 diabetes last seen 04/15/2021. ? ?Compliance with diet, lifestyle and  medications: Yes ? ?His cardiac course is stable.  He has had an element of chronic chest pain ever since bypass surgery unchanged he exercises daily walks reveals to the gym and never has exertional angina.  He has had no edema shortness of breath palpitation or syncope.  He tolerates his statin without muscle pain or weakness.  In the interim he has had GI illness diarrhea when he traveled to [place]. ? ?Most recent labs [facility] 11/05/2021: ?CMP with random glucose 127 creatinine 0.81 potassium 4.5 normal liver function test GFR greater than 90 cc ?04/03/2021 cholesterol 155 triglycerides 208 HDL 29 LDL 118 ?11/05/2021 hemoglobin A1c 7.4% ?Past Medical History:  ?Diagnosis Date  ? Benign hypertensive heart and kidney disease with HF and CKD ([place]) 11/10/2015  ? CAD, multiple vessel 01/15/2016  ? Overview:  PCI and Resolute DES to OM2 03/24/2013.    ? Cholelithiasis with chronic cholecystitis without biliary obstruction 01/15/2016  ? Chronic midline low back pain 01/15/2016  ? CKD stage G1/A1, GFR > 90 and albumin creatinine ratio <30 mg/g 01/15/2016  ? Coronary artery disease involving native coronary artery of native heart with angina pectoris ([place]) 11/10/2015  ? Overview:  Overview:  CABG 2012 PTCA of PLVB 12/21/10; 27 Dec 2010. PCI and Resolute DES to OM2 03/24/13 , cath with patent LTA and SVG, the OM stenosis was distal to the graft, EF 60% Last OP visit 07/05/2014:  [name] presents for  follow up for CAD.  Assessment   History of a CABG was done February 03, 2009  CAD, multiple vessel Well Controlled (414.00) (I25.10); PCI and Resolute DES to  ? Costochondral chest pain 11/10/2015  ? DDD (degenerative disc disease), lumbar 07/30/2016  ? Drug therapy 01/15/2016  ? Hepatic steatosis 01/15/2016  ? Hyperlipidemia 11/10/2015  ? Overview:  poorly statin tolerant, he is on a low intensity statin with a LDL>100   ? 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 Migraine 01/15/2016  ? Mixed emotional features as adjustment reaction 01/15/2016  ? Obesity (BMI 30-39.9) 01/15/2016  ? Type 2 diabetes mellitus with stage 1 chronic kidney disease, without long-term current use of insulin ([place]) 01/15/2016  ? Vitamin D deficiency 01/15/2016  ? ? ?Past Surgical History:  ?Procedure Laterality Date  ? CARDIAC CATHETERIZATION    ? CORONARY ANGIOPLASTY    ? CORONARY ARTERY BYPASS GRAFT    ? SPINE SURGERY    ? ? ?Current Medications: ?Current Meds  ?Medication Sig  ? aspirin EC 81 MG tablet Take 81 mg by mouth daily.   ? atorvastatin (LIPITOR) 40 MG tablet Take 40 mg by mouth daily.  ? B-D ULTRA-FINE 33 LANCETS MISC by Misc.(Non-Drug; Combo Route) route.  ? Blood Glucose Monitoring Suppl (GLUCOCOM BLOOD GLUCOSE MONITOR) DEVI by [place].(Non-Drug; Combo Route) route.  ? clopidogrel (PLAVIX) 75 MG tablet Take 75 mg by mouth daily.   ? DOCOSAHEXAENOIC ACID PO Take 1 g by mouth daily.   ? glimepiride (AMARYL) 2 MG tablet TAKE 1 TABLET (2 MG TOTAL) BY MOUTH 2 TIMES DAILY BEFORE MEALS.  ? lisinopril (PRINIVIL,ZESTRIL) 20 MG tablet Take 20 mg by mouth daily.   ? metFORMIN (GLUCOPHAGE) 1000 MG tablet TAKE 1 TABLET BY MOUTH TWICE A DAY WITH MEALS FOR DIABITIES  ? nitroGLYCERIN (NITROSTAT) 0.4 MG SL tablet Place 1 tablet (0.4 mg total) under the tongue every 5 (five) minutes as needed.  ? ranolazine (RANEXA) 500 MG 12 hr tablet TAKE 1 TABLET BY MOUTH TWICE A DAY  ? Vitamin D, Ergocalciferol, (DRISDOL) 50000 units CAPS capsule Take 50,000 Units by mouth every 7 (seven) days.   ? [DISCONTINUED] nitroGLYCERIN (NITROSTAT) 0.4 MG SL tablet Place 1 tablet (0.4 mg total) under the tongue every 5 (five) minutes as needed.  ?  ? ?Allergies:   Dulaglutide  ? ?Social History  ? ?Socioeconomic History  ? Marital status: Married  ?  Spouse name: Not on file  ? Number of children: Not on file  ? Years of education: Not on file  ? Highest education level: Not on file  ?Occupational History  ? Not  on file  ?Tobacco Use  ? Smoking status: Never  ?  Passive exposure: Never  ? Smokeless tobacco: Never  ?Vaping Use  ? Vaping Use: Never used  ?Substance and Sexual Activity  ? Alcohol use: No  ? Drug use: No  ? Sexual activity: Not on file  ?Other Topics Concern  ? Not on file  ?Social History Narrative  ? Not on file  ? ?Social Determinants of Health  ? ?Financial Resource Strain: Not on file  ?Food Insecurity: Not on file  ?Transportation Needs: Not on file  ?Physical Activity: Not on file  ?Stress: Not on file  ?Social Connections: Not on file  ?  ? ?Family History: ?The patient's family history includes Diabetes in his brother and sister; Diabetes type I in his son;  Hypertension in his brother. ?ROS:   ?Please see the history of present illness.    ?All other systems reviewed and are negative. ? ?EKGs/Labs/Other Studies Reviewed:   ? ?The following studies were reviewed today: ? ?EKG:  EKG ordered today and personally reviewed.  The ekg ordered today demonstrates sinus rhythm T wave inversions similar to his EKG in 1 year ago ? ? ?Physical Exam:   ? ?VS:  BP 132/70 (BP Location: Left Arm)   Pulse 66   Ht 5\' 6"  (1.676 m)   Wt 181 lb 12.8 oz (82.5 kg)   SpO2 99%   BMI 29.34 kg/m?    ? ?Wt Readings from Last 3 Encounters:  ?01/25/22 181 lb 12.8 oz (82.5 kg)  ?04/13/21 187 lb (84.8 kg)  ?09/04/20 193 lb 9.6 oz (87.8 kg)  ?  ? ?GEN:  Well nourished, well developed in no acute distress ?HEENT: Normal ?NECK: No JVD; No carotid bruits ?LYMPHATICS: No lymphadenopathy ?CARDIAC: RRR, no murmurs, rubs, gallops ?RESPIRATORY:  Clear to auscultation without rales, wheezing or rhonchi  ?ABDOMEN: Soft, non-tender, non-distended ?MUSCULOSKELETAL:  No edema; No deformity  ?SKIN: Warm and dry ?NEUROLOGIC:  Alert and oriented x 3 ?PSYCHIATRIC:  Normal affect  ? ? ?Signed, ?Shirlee More, MD  ?01/25/2022 11:58 AM    ?[place]  ?

## 2022-01-25 ENCOUNTER — Encounter: Payer: Self-pay | Admitting: Cardiology

## 2022-01-25 ENCOUNTER — Ambulatory Visit (INDEPENDENT_AMBULATORY_CARE_PROVIDER_SITE_OTHER): Payer: Medicare Other | Admitting: Cardiology

## 2022-01-25 ENCOUNTER — Other Ambulatory Visit: Payer: Self-pay

## 2022-01-25 VITALS — BP 132/70 | HR 66 | Ht 66.0 in | Wt 181.8 lb

## 2022-01-25 DIAGNOSIS — I25119 Atherosclerotic heart disease of native coronary artery with unspecified angina pectoris: Secondary | ICD-10-CM

## 2022-01-25 DIAGNOSIS — N181 Chronic kidney disease, stage 1: Secondary | ICD-10-CM

## 2022-01-25 DIAGNOSIS — E119 Type 2 diabetes mellitus without complications: Secondary | ICD-10-CM | POA: Diagnosis not present

## 2022-01-25 DIAGNOSIS — E782 Mixed hyperlipidemia: Secondary | ICD-10-CM

## 2022-01-25 DIAGNOSIS — I1310 Hypertensive heart and chronic kidney disease without heart failure, with stage 1 through stage 4 chronic kidney disease, or unspecified chronic kidney disease: Secondary | ICD-10-CM

## 2022-01-25 DIAGNOSIS — Z794 Long term (current) use of insulin: Secondary | ICD-10-CM

## 2022-01-25 MED ORDER — NITROGLYCERIN 0.4 MG SL SUBL: 0.4000 mg | SUBLINGUAL_TABLET | SUBLINGUAL | 3 refills | Status: DC | PRN

## 2022-01-25 NOTE — Patient Instructions (Addendum)
Medication Instructions:  ? ?Your physician recommends that you continue on your current medications as directed. Please refer to the Current Medication list given to you today. ? ?*If you need a refill on your cardiac medications before your next appointment, please call your pharmacy* ? ? ?Lab Work: NONE ORDERED  TODAY ? ? ? ?If you have labs (blood work) drawn today and your tests are completely normal, you will receive your results only by: ?MyChart Message (if you have MyChart) OR ?A paper copy in the mail ?If you have any lab test that is abnormal or we need to change your treatment, we will call you to review the results. ? ? ?Testing/Procedures: NONE ORDERED  TODAY ? ? ? ? ?Follow-Up: ?At [facility], you and your health needs are our priority.  As part of our continuing mission to provide you with exceptional heart care, we have created designated Provider Care Teams.  These Care Teams include your primary Cardiologist (physician) and Advanced Practice Providers (APPs -  Physician Assistants and Nurse Practitioners) who all work together to provide you with the care you need, when you need it. ? ?We recommend signing up for the patient portal called "MyChart".  Sign up information is provided on this After Visit Summary.  MyChart is used to connect with patients for Virtual Visits (Telemedicine).  Patients are able to view lab/test results, encounter notes, upcoming appointments, etc.  Non-urgent messages can be sent to your provider as well.   ?To learn more about what you can do with MyChart, go to [email or web address].   ? ?Your next appointment:   ?1 year(s) ? ?The format for your next appointment:   ?In Person ? ?Provider:   ?Brian Munley, MD  ? ? ?Other Instructions ? ?

## 2022-01-26 LAB — LIPID PANEL
Chol/HDL Ratio: 5.5 ratio — ABNORMAL HIGH (ref 0.0–5.0)
Cholesterol, Total: 159 mg/dL (ref 100–199)
HDL: 29 mg/dL — ABNORMAL LOW (ref >39)
LDL Chol Calc (NIH): 89 mg/dL (ref 0–99)
Triglycerides: 244 mg/dL — ABNORMAL HIGH (ref 0–149)
VLDL Cholesterol Cal: 41 mg/dL — ABNORMAL HIGH (ref 5–40)

## 2022-01-26 LAB — LIPOPROTEIN A (LPA): Lipoprotein (a): 42.5 nmol/L (ref <75.0)

## 2022-12-07 NOTE — Progress Notes (Unsigned)
Cardiology Office Note:    Date:  12/09/2022   ID:  [name], DOB 20-Jun-1966, MRN [id number]  PCP:  Maris Berger, MD  Cardiologist:  Shirlee More, MD    Referring MD: Maris Berger, MD    ASSESSMENT:    1. Coronary artery disease involving native coronary artery of native heart with angina pectoris ([place])   2. Hx of CABG   3. 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 Mixed hyperlipidemia   5. Type 2 diabetes mellitus without complication, with long-term current use of insulin (HCC)    PLAN:    In order of problems listed above:  Continues to do well with his CAD is not having exertional angina following both CABG PCI and on a good medical regimen And will continue his chronic dual antiplatelet therapy ranolazine nitroglycerin as needed Continue current antihypertensive lisinopril Continue his high intensity statin Managed by his PCP  Next appointment: 6 months   Medication Adjustments/Labs and Tests Ordered: Current medicines are reviewed at length with the patient today.  Concerns regarding medicines are outlined above.  No orders of the defined types were placed in this encounter.  No orders of the defined types were placed in this encounter.   Chief Complaint  Patient presents with   Follow-up   Congestive Heart Failure    History of Present Illness:    [name] is a 57 y.o. male with a hx of CAD with CABG in 2012 subsequent PCI after bypass surgery 2014 type 2 diabetes hypertension dyslipidemia and chronic chest wall pain since bypass surgery last seen 01/25/2022.  Compliance with diet, lifestyle and medications: Yes  He continues to do well he is coming to the habit of exercising walking outdoors at times he goes to the gym and that weather but is very consistent and rarely has angina and has not needed nitroglycerin recently.  No edema shortness of breath palpitation or syncope He  tolerates his lipid-lowering therapy without muscle pain or weakness  Recent labs [facility] PCP 11/06/2022 sodium 139 potassium 4.8 creatinine 0.73 GFR greater than 90 cc normal liver function test. Lipid profile with an LDL cholesterol 97 total 143 non-HDL cholesterol 114 triglycerides 138 HDL 29 Past Medical History:  Diagnosis Date   Benign hypertensive heart and kidney disease with HF and CKD ([place]) 11/10/2015   CAD, multiple vessel 01/15/2016   Overview:  PCI and Resolute DES to OM2 03/24/2013.     Cholelithiasis with chronic cholecystitis without biliary obstruction 01/15/2016   Chronic midline low back pain 01/15/2016   CKD stage G1/A1, GFR > 90 and albumin creatinine ratio <30 mg/g 01/15/2016   Coronary artery disease involving native coronary artery of native heart with angina pectoris ([place]) 11/10/2015   Overview:  Overview:  CABG 2012 PTCA of PLVB 12/21/10; 27 Dec 2010. PCI and Resolute DES to OM2 03/24/13 , cath with patent LTA and SVG, the OM stenosis was distal to the graft, EF 60% Last OP visit 07/05/2014:  [name] presents for follow up for CAD.  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    atorvastatin (LIPITOR) 40 MG tablet Take 40 mg by mouth daily.   B-D ULTRA-FINE 33 LANCETS MISC by Misc.(Non-Drug; Combo Route) route.   Blood Glucose Monitoring Suppl (GLUCOCOM BLOOD GLUCOSE MONITOR) DEVI by Misc.(Non-Drug; Combo Route) route.   clopidogrel (PLAVIX) 75 MG tablet Take 75 mg by mouth daily.    DOCOSAHEXAENOIC ACID PO Take 1 g by mouth daily.    glimepiride (AMARYL) 2 MG tablet TAKE 1 TABLET (2 MG TOTAL) BY MOUTH 2 TIMES DAILY BEFORE MEALS.   lisinopril (PRINIVIL,ZESTRIL) 20 MG tablet Take 20 mg by mouth daily.    metFORMIN (GLUCOPHAGE) 1000 MG tablet TAKE 1 TABLET BY MOUTH TWICE A DAY WITH MEALS FOR DIABITIES   nitroGLYCERIN (NITROSTAT) 0.4 MG SL tablet Place 1 tablet (0.4 mg total) under the tongue every 5 (five) minutes as needed.   ranolazine (RANEXA) 500 MG 12 hr tablet TAKE 1 TABLET BY MOUTH TWICE A DAY   Vitamin D, Ergocalciferol, (DRISDOL) 50000 units CAPS capsule Take 50,000 Units by mouth every 7 (seven) days.      Allergies:   Dulaglutide   Social History   Socioeconomic History   Marital status: Married    Spouse name: Not on file   Number of children: Not on file   Years of education: Not on file   Highest education level: Not on file  Occupational History   Not on file  Tobacco Use   Smoking status: Never    Passive exposure: Never   Smokeless tobacco: Never  Vaping Use   Vaping Use: Never used  Substance and Sexual Activity   Alcohol use: No   Drug use: No   Sexual activity: Not on file  Other Topics Concern   Not on file  Social History Narrative   Not on file   Social Determinants of Health   Financial Resource Strain: Not on file  Food Insecurity: Not on file  Transportation Needs: Not on file  Physical Activity: Not on  file  Stress: Not on file  Social Connections: Not on file     Family History: The patient's family history includes Diabetes in his brother and sister; Diabetes type I in his son; Hypertension in his brother. ROS:   Please see the history of present illness.    All other systems reviewed and are negative.  EKGs/Labs/Other Studies Reviewed:    The following studies were reviewed today:  EKG: I did not repeat EKG this visit  Recent Labs: See HPI Recent Lipid Panel    Component Value Date/Time   CHOL 159 01/25/2022 1204   TRIG 244 (H) 01/25/2022 1204   HDL 29 (L) 01/25/2022 1204   CHOLHDL 5.5 (H) 01/25/2022 1204   LDLCALC 89 01/25/2022  1204    Physical Exam:    VS:  BP (!) 144/84 (BP Location: Right Arm, Patient Position: Sitting)   Pulse 82   Ht 5\' 6"  (1.676 m)   Wt 188 lb (85.3 kg)   SpO2 97%   BMI 30.34 kg/m     Wt Readings from Last 3 Encounters:  12/09/22 188 lb (85.3 kg)  01/25/22 181 lb 12.8 oz (82.5 kg)  04/13/21 187 lb (84.8 kg)     GEN:  Well nourished, well developed in no acute distress HEENT: Normal NECK: No JVD; No carotid bruits LYMPHATICS: No lymphadenopathy CARDIAC: RRR, no murmurs, rubs, gallops RESPIRATORY:  Clear to auscultation without rales, wheezing or rhonchi  ABDOMEN: Soft, non-tender, non-distended MUSCULOSKELETAL:  No edema; No deformity  SKIN: Warm and dry NEUROLOGIC:  Alert and oriented x 3 PSYCHIATRIC:  Normal affect    Signed, Shirlee More, MD  12/09/2022 2:26 PM    [place]

## 2022-12-09 ENCOUNTER — Ambulatory Visit: Payer: Medicare Other | Attending: Cardiology | Admitting: Cardiology

## 2022-12-09 ENCOUNTER — Encounter: Payer: Self-pay | Admitting: Cardiology

## 2022-12-09 VITALS — BP 144/84 | HR 82 | Ht 66.0 in | Wt 188.0 lb

## 2022-12-09 DIAGNOSIS — Z951 Presence of aortocoronary bypass graft: Secondary | ICD-10-CM | POA: Diagnosis not present

## 2022-12-09 DIAGNOSIS — I25119 Atherosclerotic heart disease of native coronary artery with unspecified angina pectoris: Secondary | ICD-10-CM | POA: Diagnosis not present

## 2022-12-09 DIAGNOSIS — I1310 Hypertensive heart and chronic kidney disease without heart failure, with stage 1 through stage 4 chronic kidney disease, or unspecified chronic kidney disease: Secondary | ICD-10-CM | POA: Diagnosis not present

## 2022-12-09 DIAGNOSIS — E782 Mixed hyperlipidemia: Secondary | ICD-10-CM | POA: Diagnosis not present

## 2022-12-09 DIAGNOSIS — N181 Chronic kidney disease, stage 1: Secondary | ICD-10-CM

## 2022-12-09 DIAGNOSIS — E119 Type 2 diabetes mellitus without complications: Secondary | ICD-10-CM

## 2022-12-09 DIAGNOSIS — Z794 Long term (current) use of insulin: Secondary | ICD-10-CM

## 2022-12-09 MED ORDER — NITROGLYCERIN 0.4 MG SL SUBL: 0.4000 mg | SUBLINGUAL_TABLET | SUBLINGUAL | 3 refills | Status: AC | PRN

## 2022-12-09 NOTE — Patient Instructions (Signed)
Medication Instructions:  Your physician recommends that you continue on your current medications as directed. Please refer to the Current Medication list given to you today.  *If you need a refill on your cardiac medications before your next appointment, please call your pharmacy*   Lab Work: None If you have labs (blood work) drawn today and your tests are completely normal, you will receive your results only by: MyChart Message (if you have MyChart) OR A paper copy in the mail If you have any lab test that is abnormal or we need to change your treatment, we will call you to review the results.   Testing/Procedures: None   Follow-Up: At [facility], you and your health needs are our priority.  As part of our continuing mission to provide you with exceptional heart care, we have created designated Provider Care Teams.  These Care Teams include your primary Cardiologist (physician) and Advanced Practice Providers (APPs -  Physician Assistants and Nurse Practitioners) who all work together to provide you with the care you need, when you need it.  We recommend signing up for the patient portal called "MyChart".  Sign up information is provided on this After Visit Summary.  MyChart is used to connect with patients for Virtual Visits (Telemedicine).  Patients are able to view lab/test results, encounter notes, upcoming appointments, etc.  Non-urgent messages can be sent to your provider as well.   To learn more about what you can do with MyChart, go to [email or web address].    Your next appointment:   6 month(s)  Provider:   Brian Munley, MD    Other Instructions None  

## 2022-12-09 NOTE — Addendum Note (Signed)
Addended by: Edwyna Shell I on: 12/09/2022 03:07 PM   Modules accepted: Orders

## 2023-07-14 NOTE — Progress Notes (Unsigned)
Cardiology Office Note:    Date:  07/15/2023   ID:  [name], DOB [birth year], MRN [id number]  PCP:  Everlean Cherry, MD  Cardiologist:  Norman Herrlich, MD    Referring MD: Everlean Cherry, MD    ASSESSMENT:    1. Coronary artery disease involving native coronary artery of native heart with angina pectoris (HCC)   2. Hx of CABG   3. 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 Mixed hyperlipidemia   5. Type 2 diabetes mellitus without complication, with long-term current use of insulin (HCC)    PLAN:    In order of problems listed above:  He continues to do well with CAD having no anginal discomfort after surgical percutaneous revascularization will continue his long-term dual antiplatelet therapy lipid-lowering with a high intensity statin and ranolazine which has been remarkably effective in improving his anginal symptoms. At this time would not advise an ischemia evaluation Hypertension well-controlled Lipids are at target continue statin Stable diabetes managed with his PCP insulin therapy metformin   Next appointment: 6 months   Medication Adjustments/Labs and Tests Ordered: Current medicines are reviewed at length with the patient today.  Concerns regarding medicines are outlined above.  Orders Placed This Encounter  Procedures   EKG 12-Lead   No orders of the defined types were placed in this encounter.    History of Present Illness:    [name] is a 57 y.o. male with a hx of CAD with CABG 2012 subsequent PCI after bypass surgery 2014 type 2 diabetes hypertension dyslipidemia chronic chest wall pain since bypass surgery last seen 12/09/2022.  Recent labs 05/23/2019 hemoglobin A1c 6.8 sodium 140 potassium 5.0 creatinine 0.9 GFR greater than 90 cc/min Lipid profile 11/06/2022 cholesterol 143 LDL 97 triglycerides 138 HDL 29  Compliance with diet, lifestyle and medications: Yes  He continues to  do well he is in a regular activity program exercises and is not having anginal discomfort Hot humid weather he is feels fatigued, short of breath but no significant limitations no edema orthopnea palpitation or syncope He tolerates his lipid-lowering therapy without muscle pain or weakness he remains on long-term dual antiplatelet therapy Past Medical History:  Diagnosis Date   Benign hypertensive heart and kidney disease with HF and CKD (HCC) 11/10/2015   CAD, multiple vessel 01/15/2016   Overview:  PCI and Resolute DES to OM2 03/24/2013.     Cholelithiasis with chronic cholecystitis without biliary obstruction 01/15/2016   Chronic midline low back pain 01/15/2016   CKD stage G1/A1, GFR > 90 and albumin creatinine ratio <30 mg/g 01/15/2016   Coronary artery disease involving native coronary artery of native heart with angina pectoris (HCC) 11/10/2015   Overview:  Overview:  CABG 2012 PTCA of PLVB 12/21/10; 27 Dec 2010. PCI and Resolute DES to OM2 03/24/13 , cath with patent LTA and SVG, the OM stenosis was distal to the graft, EF 60% Last OP visit 07/05/2014:  [name] presents for follow up for CAD.  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    atorvastatin (LIPITOR) 40 MG tablet Take 40 mg by mouth daily.   B-D ULTRA-FINE 33 LANCETS MISC by Misc.(Non-Drug; Combo Route) route.   Blood Glucose Monitoring Suppl (GLUCOCOM BLOOD GLUCOSE MONITOR) DEVI by Misc.(Non-Drug; Combo Route) route.   clopidogrel (PLAVIX) 75 MG tablet Take 75 mg by mouth daily.    DOCOSAHEXAENOIC ACID PO Take 1 g by mouth daily.    glimepiride (AMARYL) 2 MG tablet TAKE 1 TABLET (2 MG TOTAL) BY MOUTH 2 TIMES DAILY BEFORE MEALS.   lisinopril (PRINIVIL,ZESTRIL) 20 MG tablet Take 20 mg by mouth daily.    metFORMIN (GLUCOPHAGE) 1000 MG tablet TAKE 1 TABLET BY MOUTH TWICE A DAY WITH MEALS FOR DIABITIES   nitroGLYCERIN (NITROSTAT) 0.4 MG SL tablet Place 1 tablet (0.4 mg total) under the tongue every 5 (five) minutes as needed for chest pain.   ranolazine (RANEXA) 500 MG 12 hr tablet TAKE 1 TABLET BY MOUTH TWICE A DAY   Vitamin D, Ergocalciferol, (DRISDOL) 50000 units CAPS capsule Take 50,000 Units by mouth every 7 (seven) days.       EKGs/Labs/Other Studies Reviewed:    The following studies were reviewed today:  Cardiac Studies & Procedures     STRESS TESTS  MYOCARDIAL PERFUSION IMAGING 09/08/2019              EKG Interpretation Date/Time:  Tuesday July 15 2023 08:59:48 EDT Ventricular Rate:  67 PR Interval:  180 QRS Duration:  90 QT Interval:  358 QTC Calculation: 378 R Axis:   40  Text Interpretation: Sinus rhythm with Sinus arrhythmia Poor R wave progression Nonspecific T waves No previous ECGs available Confirmed by Norman Herrlich ([id number]) on 07/15/2023 9:40:29 AM    Recent Lipid Panel    Component Value Date/Time   CHOL 159 01/25/2022 1204   TRIG 244 (H) 01/25/2022 1204   HDL 29 (L) 01/25/2022 1204   CHOLHDL 5.5 (H) 01/25/2022 1204   LDLCALC 89 01/25/2022 1204    Physical Exam:    VS:  BP 132/68 (BP Location: Right Arm, Patient Position:  Sitting, Cuff Size: Normal)   Pulse 67   Ht 5\' 6"  (1.676 m)   Wt 193 lb 3.2 oz (87.6 kg)   SpO2 96%   BMI 31.18 kg/m     Wt Readings from Last 3 Encounters:  07/15/23 193 lb 3.2 oz (87.6 kg)  12/09/22 188 lb (85.3 kg)  01/25/22 181 lb 12.8 oz (82.5 kg)     GEN:  Well nourished, well developed in no acute distress HEENT: Normal NECK: No JVD; No carotid bruits LYMPHATICS: No lymphadenopathy CARDIAC: RRR, no murmurs, rubs, gallops RESPIRATORY:  Clear to auscultation without rales, wheezing or rhonchi  ABDOMEN: Soft, non-tender, non-distended MUSCULOSKELETAL:  No edema; No deformity  SKIN: Warm and dry NEUROLOGIC:  Alert and oriented x 3 PSYCHIATRIC:  Normal affect    Signed, Norman Herrlich, MD  07/15/2023 9:41 AM    [facility]

## 2023-07-15 ENCOUNTER — Ambulatory Visit: Payer: Medicare Other | Attending: Cardiology | Admitting: Cardiology

## 2023-07-15 ENCOUNTER — Encounter: Payer: Self-pay | Admitting: Cardiology

## 2023-07-15 VITALS — BP 132/68 | HR 67 | Ht 66.0 in | Wt 193.2 lb

## 2023-07-15 DIAGNOSIS — I25119 Atherosclerotic heart disease of native coronary artery with unspecified angina pectoris: Secondary | ICD-10-CM | POA: Diagnosis not present

## 2023-07-15 DIAGNOSIS — E782 Mixed hyperlipidemia: Secondary | ICD-10-CM

## 2023-07-15 DIAGNOSIS — E119 Type 2 diabetes mellitus without complications: Secondary | ICD-10-CM

## 2023-07-15 DIAGNOSIS — I1310 Hypertensive heart and chronic kidney disease without heart failure, with stage 1 through stage 4 chronic kidney disease, or unspecified chronic kidney disease: Secondary | ICD-10-CM | POA: Diagnosis not present

## 2023-07-15 DIAGNOSIS — N181 Chronic kidney disease, stage 1: Secondary | ICD-10-CM

## 2023-07-15 DIAGNOSIS — Z951 Presence of aortocoronary bypass graft: Secondary | ICD-10-CM

## 2023-07-15 DIAGNOSIS — Z794 Long term (current) use of insulin: Secondary | ICD-10-CM

## 2023-07-15 NOTE — Patient Instructions (Signed)

## 2024-01-15 ENCOUNTER — Other Ambulatory Visit: Payer: Self-pay

## 2024-01-15 NOTE — Progress Notes (Signed)
 Cardiology Office Note:    Date:  01/19/2024   ID:  [name], DOB [birth year], MRN [id number]  PCP:  Everlean Cherry, MD  Cardiologist:  Norman Herrlich, MD    Referring MD: Everlean Cherry, MD    ASSESSMENT:    1. Coronary artery disease involving native coronary artery of native heart with angina pectoris (HCC)   2. Hx of CABG   3. 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 Mixed hyperlipidemia   5. Type 2 diabetes mellitus without complication, with long-term current use of insulin (HCC)    PLAN:    In order of problems listed above:  He is doing well in terms of CAD following both surgical and percutaneous revascularization having no anginal discomfort he will continue his medical regimen including low-dose aspirin 81 mg and clopidogrel daily atorvastatin 40 mg/day and ranolazine.  At this time I would not advise an ischemia evaluation Hypertension controlled continue ACE inhibitor with type 2 diabetes Recheck labs today including a CMP and lipid profile he is overdue also April be to optimize lipid-lowering treatment high risk group Stable diabetes managed with his PCP   Next appointment: 9 months   Medication Adjustments/Labs and Tests Ordered: Current medicines are reviewed at length with the patient today.  Concerns regarding medicines are outlined above.  No orders of the defined types were placed in this encounter.  No orders of the defined types were placed in this encounter.    History of Present Illness:    [name] is a 58 y.o. male with a hx of CAD with CABG 2012 and subsequent PCI after bypass surgery 2014 type 2 diabetes hypertension dyslipidemia chronic chest wall pain since bypass surgery last seen 07/15/2023.  Compliance with diet, lifestyle and medications: Yes  Just returned from spending 5 months in [place] has really been good for the quality of his life he feels very well and is  not having chest pain edema shortness of breath palpitation or syncope. He tolerates his high intensity statin without muscle pain or weakness He has no dizziness with ranolazine Past Medical History:  Diagnosis Date   Benign hypertensive heart and kidney disease with HF and CKD (HCC) 11/10/2015   CAD, multiple vessel 01/15/2016   Overview:  PCI and Resolute DES to OM2 03/24/2013.     Cholelithiasis with chronic cholecystitis without biliary obstruction 01/15/2016   Chronic midline low back pain 01/15/2016   CKD stage G1/A1, GFR > 90 and albumin creatinine ratio <30 mg/g 01/15/2016   Coronary artery disease involving native coronary artery of native heart with angina pectoris (HCC) 11/10/2015   Overview:  Overview:  CABG 2012 PTCA of PLVB 12/21/10; 27 Dec 2010. PCI and Resolute DES to OM2 03/24/13 , cath with patent LTA and SVG, the OM stenosis was distal to the graft, EF 60% Last OP visit 07/05/2014:  [name] presents for follow up for CAD.  Assessment   History of a CABG was done February 03, 2009  CAD, multiple vessel Well Controlled (414.00) (I25.10); PCI and Resolute DES to   Costochondral chest pain 11/10/2015   DDD (degenerative disc disease), lumbar 07/30/2016   Drug therapy 01/15/2016   Hepatic steatosis 01/15/2016   Hyperlipidemia 11/10/2015   Overview:  poorly statin tolerant, he is on a low intensity statin with a LDL>100    Hypertensive heart and kidney disease without heart failure and with chronic kidney disease  stage I 01/15/2016   Migraine 01/15/2016   Mixed emotional features as adjustment reaction 01/15/2016   Obesity (BMI 30-39.9) 01/15/2016   Type 2 diabetes mellitus with stage 1 chronic kidney disease, without long-term current use of insulin (HCC) 01/15/2016   Vitamin D deficiency 01/15/2016    Current Medications: Current Meds  Medication Sig   aspirin EC 81 MG tablet Take 81 mg by mouth daily.    atorvastatin (LIPITOR) 40 MG tablet Take 40 mg by mouth daily.    clopidogrel (PLAVIX) 75 MG tablet Take 75 mg by mouth daily.    DOCOSAHEXAENOIC ACID PO Take 1 g by mouth daily.    glimepiride (AMARYL) 2 MG tablet TAKE 1 TABLET (2 MG TOTAL) BY MOUTH 2 TIMES DAILY BEFORE MEALS.   lisinopril (PRINIVIL,ZESTRIL) 20 MG tablet Take 20 mg by mouth daily.    metFORMIN (GLUCOPHAGE) 1000 MG tablet TAKE 1 TABLET BY MOUTH TWICE A DAY WITH MEALS FOR DIABITIES   nitroGLYCERIN (NITROSTAT) 0.4 MG SL tablet Place 1 tablet (0.4 mg total) under the tongue every 5 (five) minutes as needed for chest pain.   ranolazine (RANEXA) 500 MG 12 hr tablet TAKE 1 TABLET BY MOUTH TWICE A DAY   Vitamin D, Ergocalciferol, (DRISDOL) 50000 units CAPS capsule Take 50,000 Units by mouth every 7 (seven) days.       EKGs/Labs/Other Studies Reviewed:    The following studies were reviewed today:  Cardiac Studies & Procedures   ______________________________________________________________________________________________   STRESS TESTS  MYOCARDIAL PERFUSION IMAGING 09/08/2019            ______________________________________________________________________________________________          Recent Labs: No results found for requested labs within last 365 days.  Recent Lipid Panel    Component Value Date/Time   CHOL 159 01/25/2022 1204   TRIG 244 (H) 01/25/2022 1204   HDL 29 (L) 01/25/2022 1204   CHOLHDL 5.5 (H) 01/25/2022 1204   LDLCALC 89 01/25/2022 1204    Physical Exam:    VS:  BP 130/66   Pulse 66   Ht 5\' 6"  (1.676 m)   Wt 191 lb 12.8 oz (87 kg)   SpO2 96%   BMI 30.96 kg/m     Wt Readings from Last 3 Encounters:  01/19/24 191 lb 12.8 oz (87 kg)  07/15/23 193 lb 3.2 oz (87.6 kg)  12/09/22 188 lb (85.3 kg)     GEN:  Well nourished, well developed in no acute distress HEENT: Normal NECK: No JVD; No carotid bruits LYMPHATICS: No lymphadenopathy CARDIAC: RRR, no murmurs, rubs, gallops RESPIRATORY:  Clear to auscultation without rales, wheezing or rhonchi   ABDOMEN: Soft, non-tender, non-distended MUSCULOSKELETAL:  No edema; No deformity  SKIN: Warm and dry NEUROLOGIC:  Alert and oriented x 3 PSYCHIATRIC:  Normal affect    Signed, Norman Herrlich, MD  01/19/2024 11:01 AM    [facility]

## 2024-01-19 ENCOUNTER — Ambulatory Visit: Payer: Medicare Other | Attending: Cardiology | Admitting: Cardiology

## 2024-01-19 ENCOUNTER — Encounter: Payer: Self-pay | Admitting: Cardiology

## 2024-01-19 VITALS — BP 130/66 | HR 66 | Ht 66.0 in | Wt 191.8 lb

## 2024-01-19 DIAGNOSIS — Z951 Presence of aortocoronary bypass graft: Secondary | ICD-10-CM

## 2024-01-19 DIAGNOSIS — I25119 Atherosclerotic heart disease of native coronary artery with unspecified angina pectoris: Secondary | ICD-10-CM

## 2024-01-19 DIAGNOSIS — E782 Mixed hyperlipidemia: Secondary | ICD-10-CM

## 2024-01-19 DIAGNOSIS — E119 Type 2 diabetes mellitus without complications: Secondary | ICD-10-CM

## 2024-01-19 DIAGNOSIS — N181 Chronic kidney disease, stage 1: Secondary | ICD-10-CM

## 2024-01-19 DIAGNOSIS — Z794 Long term (current) use of insulin: Secondary | ICD-10-CM

## 2024-01-19 DIAGNOSIS — I1310 Hypertensive heart and chronic kidney disease without heart failure, with stage 1 through stage 4 chronic kidney disease, or unspecified chronic kidney disease: Secondary | ICD-10-CM | POA: Diagnosis not present

## 2024-01-19 NOTE — Patient Instructions (Signed)
 Medication Instructions:  Your physician recommends that you continue on your current medications as directed. Please refer to the Current Medication list given to you today.  *If you need a refill on your cardiac medications before your next appointment, please call your pharmacy*   Lab Work: Your physician recommends that you return for lab work in:   Labs today: CMP, Lipids, Apo B  If you have labs (blood work) drawn today and your tests are completely normal, you will receive your results only by: MyChart Message (if you have MyChart) OR A paper copy in the mail If you have any lab test that is abnormal or we need to change your treatment, we will call you to review the results.   Testing/Procedures: None   Follow-Up: At [facility], you and your health needs are our priority.  As part of our continuing mission to provide you with exceptional heart care, we have created designated Provider Care Teams.  These Care Teams include your primary Cardiologist (physician) and Advanced Practice Providers (APPs -  Physician Assistants and Nurse Practitioners) who all work together to provide you with the care you need, when you need it.  We recommend signing up for the patient portal called "MyChart".  Sign up information is provided on this After Visit Summary.  MyChart is used to connect with patients for Virtual Visits (Telemedicine).  Patients are able to view lab/test results, encounter notes, upcoming appointments, etc.  Non-urgent messages can be sent to your provider as well.   To learn more about what you can do with MyChart, go to [email or web address].    Your next appointment:   9 month(s)  Provider:   Norman Herrlich, MD    Other Instructions None

## 2024-01-20 ENCOUNTER — Other Ambulatory Visit: Payer: Self-pay

## 2024-01-20 DIAGNOSIS — E782 Mixed hyperlipidemia: Secondary | ICD-10-CM

## 2024-01-20 LAB — LIPID PANEL
Chol/HDL Ratio: 5 ratio (ref 0.0–5.0)
Cholesterol, Total: 149 mg/dL (ref 100–199)
HDL: 30 mg/dL — ABNORMAL LOW (ref >39)
LDL Chol Calc (NIH): 73 mg/dL (ref 0–99)
Triglycerides: 283 mg/dL — ABNORMAL HIGH (ref 0–149)
VLDL Cholesterol Cal: 46 mg/dL — ABNORMAL HIGH (ref 5–40)

## 2024-01-20 LAB — COMPREHENSIVE METABOLIC PANEL
ALT: 59 IU/L — ABNORMAL HIGH (ref 0–44)
AST: 41 IU/L — ABNORMAL HIGH (ref 0–40)
Albumin: 4.3 g/dL (ref 3.8–4.9)
Alkaline Phosphatase: 46 IU/L (ref 44–121)
BUN/Creatinine Ratio: 19 (ref 9–20)
BUN: 16 mg/dL (ref 6–24)
Bilirubin Total: 0.6 mg/dL (ref 0.0–1.2)
CO2: 23 mmol/L (ref 20–29)
Calcium: 9.8 mg/dL (ref 8.7–10.2)
Chloride: 103 mmol/L (ref 96–106)
Creatinine, Ser: 0.85 mg/dL (ref 0.76–1.27)
Globulin, Total: 2.5 g/dL (ref 1.5–4.5)
Glucose: 210 mg/dL — ABNORMAL HIGH (ref 70–99)
Potassium: 5.1 mmol/L (ref 3.5–5.2)
Sodium: 140 mmol/L (ref 134–144)
Total Protein: 6.8 g/dL (ref 6.0–8.5)
eGFR: 100 mL/min/{1.73_m2} (ref >59)

## 2024-01-20 LAB — APOLIPOPROTEIN B: Apolipoprotein B: 99 mg/dL — ABNORMAL HIGH (ref <90)

## 2024-01-20 MED ORDER — EZETIMIBE 10 MG PO TABS: 10.0000 mg | ORAL_TABLET | Freq: Every day | ORAL | 3 refills | Status: DC

## 2024-06-02 ENCOUNTER — Telehealth: Payer: Self-pay

## 2024-06-03 NOTE — Telephone Encounter (Signed)
 Opened in error

## 2024-10-14 NOTE — Progress Notes (Signed)
 Cardiology Office Note:    Date:  10/18/2024   ID:  [name], DOB 06/04/1966, MRN [id number]  PCP:  Magdaline Debby HERO, MD  Cardiologist:  Redell Leiter, MD    Referring MD: Magdaline Debby HERO, MD    ASSESSMENT:    1. Coronary artery disease involving native coronary artery of native heart with angina pectoris   2. Hx of CABG   3. Mixed hyperlipidemia   4. 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 Type 2 diabetes mellitus without complication, with long-term current use of insulin (HCC)    PLAN:    In order of problems listed above:  Stable CAD following remote CABG and PCI continues longstanding dual antiplatelet therapy ranolazine  and lipid-lowering with combination of statin and Zetia  recheck lipids APO B today goal LDL less than 70 ideally less than 55 in combination treatment Hypertension controlled continue current medications Stable CKD Stable diabetes managed by his PCP with an SGLT2 inhibitor and Amaryl   Next appointment: 1 year   Medication Adjustments/Labs and Tests Ordered: Current medicines are reviewed at length with the patient today.  Concerns regarding medicines are outlined above.  Orders Placed This Encounter  Procedures   EKG 12-Lead   No orders of the defined types were placed in this encounter.    History of Present Illness:    [name] is a 58 y.o. male with a hx of CAD with CABG 2012 and subsequent PCI after bypass surgery 2014 type 2 diabetes hypertension dyslipidemia and chronic chest wall pain since the time of his bypass surgery.  Last seen 01/19/2024.  Recently ApoB was elevated and Zetia  was added to his medical regimen high intensity statin to optimize lipid treatment.  Recent labs 02/25/2024 CMP normal except for glucose of 170 he did have elevated albumin to creatinine ratio potassium 4.7 GFR greater than 90 cc normal liver function test  Compliance with diet,  lifestyle and medications: Yes  He continues to do well walks every day not having anginal discomfort claudication edema shortness of breath palpitation or syncope.  He tolerates his lipid-lowering therapy without muscle pain or weakness Past Medical History:  Diagnosis Date   Benign hypertensive heart and kidney disease with HF and CKD (HCC) 11/10/2015   CAD, multiple vessel 01/15/2016   Overview:  PCI and Resolute DES to OM2 03/24/2013.     Cholelithiasis with chronic cholecystitis without biliary obstruction 01/15/2016   Chronic midline low back pain 01/15/2016   CKD stage G1/A1, GFR > 90 and albumin creatinine ratio <30 mg/g 01/15/2016   Coronary artery disease involving native coronary artery of native heart with angina pectoris 11/10/2015   Overview:  Overview:  CABG 2012 PTCA of PLVB 12/21/10; 27 Dec 2010. PCI and Resolute DES to OM2 03/24/13 , cath with patent LTA and SVG, the OM stenosis was distal to the graft, EF 60% Last OP visit 07/05/2014:  Delene Pillion presents for follow up for CAD.  Assessment   History of a CABG was done February 03, 2009  CAD, multiple vessel Well Controlled (414.00) (I25.10); PCI and Resolute DES to   Costochondral chest pain 11/10/2015   DDD (degenerative disc disease), lumbar 07/30/2016   Drug therapy 01/15/2016   Hepatic steatosis 01/15/2016   Hyperlipidemia 11/10/2015   Overview:  poorly statin tolerant, he is on a low intensity statin with a LDL>100    Hypertensive heart and kidney disease without heart failure  and with chronic kidney disease stage I 01/15/2016   Migraine 01/15/2016   Mixed emotional features as adjustment reaction 01/15/2016   Obesity (BMI 30-39.9) 01/15/2016   Type 2 diabetes mellitus with stage 1 chronic kidney disease, without long-term current use of insulin (HCC) 01/15/2016   Vitamin D deficiency 01/15/2016    Current Medications: Current Meds  Medication Sig   aspirin EC 81 MG tablet Take 81 mg by mouth daily.    atorvastatin  (LIPITOR) 40 MG tablet Take 40 mg by mouth daily.   clopidogrel (PLAVIX) 75 MG tablet Take 75 mg by mouth daily.    DOCOSAHEXAENOIC ACID PO Take 1 g by mouth daily.    ezetimibe  (ZETIA ) 10 MG tablet Take 1 tablet (10 mg total) by mouth daily.   glimepiride (AMARYL) 2 MG tablet TAKE 1 TABLET (2 MG TOTAL) BY MOUTH 2 TIMES DAILY BEFORE MEALS.   JARDIANCE 10 MG TABS tablet Take 10 mg by mouth daily.   lisinopril (PRINIVIL,ZESTRIL) 20 MG tablet Take 20 mg by mouth daily.    metFORMIN (GLUCOPHAGE) 1000 MG tablet TAKE 1 TABLET BY MOUTH TWICE A DAY WITH MEALS FOR DIABITIES   metoprolol tartrate (LOPRESSOR) 100 MG tablet Take 100 mg by mouth daily.   nitroGLYCERIN  (NITROSTAT ) 0.4 MG SL tablet Place 1 tablet (0.4 mg total) under the tongue every 5 (five) minutes as needed for chest pain.   ranolazine  (RANEXA ) 500 MG 12 hr tablet TAKE 1 TABLET BY MOUTH TWICE A DAY   Vitamin D, Ergocalciferol, (DRISDOL) 50000 units CAPS capsule Take 50,000 Units by mouth every 7 (seven) days.       EKGs/Labs/Other Studies Reviewed:    The following studies were reviewed today:  Cardiac Studies & Procedures   ______________________________________________________________________________________________   STRESS TESTS  MYOCARDIAL PERFUSION IMAGING 09/08/2019            ______________________________________________________________________________________________      EKG Interpretation Date/Time:  Monday October 18 2024 13:02:15 EST Ventricular Rate:  64 PR Interval:  154 QRS Duration:  86 QT Interval:  350 QTC Calculation: 361 R Axis:   88  Text Interpretation: Normal sinus rhythm Inferior infarct possible ABNORMAL R WAVE PROGRESSION (cited on or before 18-Oct-2024) Abnormal ECG When compared with ECG of 15-Jul-2023 08:59, No significant change since last tracing Confirmed by Monetta Rogue ([id number]) on 10/18/2024 1:07:46 PM   Recent Labs: 01/19/2024: ALT 59; BUN 16; Creatinine, Ser 0.85; Potassium  5.1; Sodium 140  Recent Lipid Panel    Component Value Date/Time   CHOL 149 01/19/2024 1114   TRIG 283 (H) 01/19/2024 1114   HDL 30 (L) 01/19/2024 1114   CHOLHDL 5.0 01/19/2024 1114   LDLCALC 73 01/19/2024 1114   EKG Interpretation Date/Time:  Monday October 18 2024 13:02:15 EST Ventricular Rate:  64 PR Interval:  154 QRS Duration:  86 QT Interval:  350 QTC Calculation: 361 R Axis:   88  Text Interpretation: Normal sinus rhythm Inferior infarct possible ABNORMAL R WAVE PROGRESSION (cited on or before 18-Oct-2024) Abnormal ECG When compared with ECG of 15-Jul-2023 08:59, No significant change since last tracing Confirmed by Monetta Rogue ([id number]) on 10/18/2024 1:07:46 PM    Physical Exam:    VS:  BP (!) 144/74   Pulse 64   Ht 5' 6 (1.676 m)   Wt 188 lb 3.2 oz (85.4 kg)   SpO2 96%   BMI 30.38 kg/m     Wt Readings from Last 3 Encounters:  10/18/24 188 lb 3.2 oz (85.4 kg)  01/19/24 191 lb 12.8 oz (87 kg)  07/15/23 193 lb 3.2 oz (87.6 kg)     GEN:  Well nourished, well developed in no acute distress HEENT: Normal NECK: No JVD; No carotid bruits LYMPHATICS: No lymphadenopathy CARDIAC: RRR, no murmurs, rubs, gallops RESPIRATORY:  Clear to auscultation without rales, wheezing or rhonchi  ABDOMEN: Soft, non-tender, non-distended MUSCULOSKELETAL:  No edema; No deformity  SKIN: Warm and dry NEUROLOGIC:  Alert and oriented x 3 PSYCHIATRIC:  Normal affect    Signed, Redell Leiter, MD  10/18/2024 1:08 PM     [facility]

## 2024-10-18 ENCOUNTER — Encounter: Payer: Self-pay | Admitting: Cardiology

## 2024-10-18 ENCOUNTER — Ambulatory Visit: Attending: Cardiology | Admitting: Cardiology

## 2024-10-18 VITALS — BP 138/70 | HR 64 | Ht 66.0 in | Wt 188.2 lb

## 2024-10-18 DIAGNOSIS — I1310 Hypertensive heart and chronic kidney disease without heart failure, with stage 1 through stage 4 chronic kidney disease, or unspecified chronic kidney disease: Secondary | ICD-10-CM | POA: Diagnosis not present

## 2024-10-18 DIAGNOSIS — I25119 Atherosclerotic heart disease of native coronary artery with unspecified angina pectoris: Secondary | ICD-10-CM

## 2024-10-18 DIAGNOSIS — Z951 Presence of aortocoronary bypass graft: Secondary | ICD-10-CM

## 2024-10-18 DIAGNOSIS — N181 Chronic kidney disease, stage 1: Secondary | ICD-10-CM

## 2024-10-18 DIAGNOSIS — E119 Type 2 diabetes mellitus without complications: Secondary | ICD-10-CM

## 2024-10-18 DIAGNOSIS — Z794 Long term (current) use of insulin: Secondary | ICD-10-CM

## 2024-10-18 DIAGNOSIS — E782 Mixed hyperlipidemia: Secondary | ICD-10-CM | POA: Diagnosis not present

## 2024-10-18 NOTE — Patient Instructions (Signed)
 Medication Instructions:  Your physician recommends that you continue on your current medications as directed. Please refer to the Current Medication list given to you today.  *If you need a refill on your cardiac medications before your next appointment, please call your pharmacy*  Lab Work: Your physician recommends that you return for lab work in:   Labs today: Apo B. Lipids, CMP  If you have labs (blood work) drawn today and your tests are completely normal, you will receive your results only by: MyChart Message (if you have MyChart) OR A paper copy in the mail If you have any lab test that is abnormal or we need to change your treatment, we will call you to review the results.  Testing/Procedures: None  Follow-Up: At [facility], you and your health needs are our priority.  As part of our continuing mission to provide you with exceptional heart care, our providers are all part of one team.  This team includes your primary Cardiologist (physician) and Advanced Practice Providers or APPs (Physician Assistants and Nurse Practitioners) who all work together to provide you with the care you need, when you need it.  Your next appointment:   1 year(s)  Provider:   Redell Leiter, MD    We recommend signing up for the patient portal called MyChart.  Sign up information is provided on this After Visit Summary.  MyChart is used to connect with patients for Virtual Visits (Telemedicine).  Patients are able to view lab/test results, encounter notes, upcoming appointments, etc.  Non-urgent messages can be sent to your provider as well.   To learn more about what you can do with MyChart, go to [email or web address].   Other Instructions None

## 2024-10-20 ENCOUNTER — Other Ambulatory Visit: Payer: Self-pay

## 2024-10-20 ENCOUNTER — Ambulatory Visit: Payer: Self-pay | Admitting: Cardiology

## 2024-10-20 LAB — COMPREHENSIVE METABOLIC PANEL WITH GFR
ALT: 27 IU/L (ref 0–44)
AST: 19 IU/L (ref 0–40)
Albumin: 4.4 g/dL (ref 3.8–4.9)
Alkaline Phosphatase: 50 IU/L (ref 47–123)
BUN/Creatinine Ratio: 18 (ref 10–24)
BUN: 13 mg/dL (ref 8–27)
Bilirubin Total: 0.3 mg/dL (ref 0.0–1.2)
CO2: 23 mmol/L (ref 20–29)
Calcium: 10 mg/dL (ref 8.6–10.2)
Chloride: 97 mmol/L (ref 96–106)
Creatinine, Ser: 0.74 mg/dL — ABNORMAL LOW (ref 0.76–1.27)
Globulin, Total: 2.6 g/dL (ref 1.5–4.5)
Glucose: 183 mg/dL — ABNORMAL HIGH (ref 70–99)
Potassium: 4.6 mmol/L (ref 3.5–5.2)
Sodium: 135 mmol/L (ref 134–144)
Total Protein: 7 g/dL (ref 6.0–8.5)
eGFR: 104 mL/min/1.73 (ref >59)

## 2024-10-20 LAB — LIPID PANEL
Chol/HDL Ratio: 5.3 ratio — ABNORMAL HIGH (ref 0.0–5.0)
Cholesterol, Total: 163 mg/dL (ref 100–199)
HDL: 31 mg/dL — ABNORMAL LOW (ref >39)
LDL Chol Calc (NIH): 80 mg/dL (ref 0–99)
Triglycerides: 318 mg/dL — ABNORMAL HIGH (ref 0–149)
VLDL Cholesterol Cal: 52 mg/dL — ABNORMAL HIGH (ref 5–40)

## 2024-10-20 LAB — APOLIPOPROTEIN B: Apolipoprotein B: 102 mg/dL — ABNORMAL HIGH (ref <90)

## 2024-10-27 ENCOUNTER — Other Ambulatory Visit: Payer: Self-pay

## 2024-10-27 MED ORDER — ATORVASTATIN CALCIUM 80 MG PO TABS: 80.0000 mg | ORAL_TABLET | Freq: Every day | ORAL | 3 refills | Status: AC

## 2024-10-27 NOTE — Telephone Encounter (Signed)
 Pt returning call. Please advise.

## 2024-12-18 ENCOUNTER — Other Ambulatory Visit: Payer: Self-pay | Admitting: Cardiology
